# Patient Record
Sex: Female | Born: 1980
Health system: Southern US, Community
[De-identification: ages and names within clinical notes are randomized; demographics above are authoritative.]

## PROBLEM LIST (undated history)

## (undated) ENCOUNTER — Inpatient Hospital Stay: Payer: Self-pay

## (undated) DIAGNOSIS — F419 Anxiety disorder, unspecified: Secondary | ICD-10-CM

## (undated) DIAGNOSIS — R112 Nausea with vomiting, unspecified: Secondary | ICD-10-CM

## (undated) DIAGNOSIS — O139 Gestational [pregnancy-induced] hypertension without significant proteinuria, unspecified trimester: Secondary | ICD-10-CM

## (undated) DIAGNOSIS — Z9889 Other specified postprocedural states: Secondary | ICD-10-CM

## (undated) HISTORY — PX: BACK SURGERY: SHX140

---
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
## 2006-07-04 ENCOUNTER — Ambulatory Visit: Payer: Self-pay | Admitting: Gastroenterology

## 2006-07-12 ENCOUNTER — Ambulatory Visit: Payer: Self-pay | Admitting: Gastroenterology

## 2007-07-28 ENCOUNTER — Ambulatory Visit: Payer: Self-pay | Admitting: Obstetrics and Gynecology

## 2008-04-22 ENCOUNTER — Ambulatory Visit: Payer: Self-pay | Admitting: Obstetrics and Gynecology

## 2008-04-23 ENCOUNTER — Ambulatory Visit: Payer: Self-pay | Admitting: Obstetrics and Gynecology

## 2009-06-27 ENCOUNTER — Observation Stay: Payer: Self-pay | Admitting: Obstetrics and Gynecology

## 2009-07-01 ENCOUNTER — Ambulatory Visit: Payer: Self-pay | Admitting: Obstetrics and Gynecology

## 2009-07-04 ENCOUNTER — Inpatient Hospital Stay: Payer: Self-pay | Admitting: Obstetrics and Gynecology

## 2011-09-07 ENCOUNTER — Ambulatory Visit: Payer: Self-pay | Admitting: Gastroenterology

## 2014-10-08 DIAGNOSIS — N970 Female infertility associated with anovulation: Secondary | ICD-10-CM | POA: Insufficient documentation

## 2015-12-23 LAB — OB RESULTS CONSOLE HEPATITIS B SURFACE ANTIGEN: HEP B S AG: NEGATIVE

## 2015-12-23 LAB — OB RESULTS CONSOLE RUBELLA ANTIBODY, IGM: Rubella: IMMUNE

## 2015-12-23 LAB — OB RESULTS CONSOLE RPR: RPR: NONREACTIVE

## 2015-12-23 LAB — OB RESULTS CONSOLE VARICELLA ZOSTER ANTIBODY, IGG: VARICELLA IGG: IMMUNE

## 2016-06-14 ENCOUNTER — Inpatient Hospital Stay: Admission: RE | Admit: 2016-06-14 | Payer: Self-pay | Source: Ambulatory Visit

## 2016-06-19 LAB — OB RESULTS CONSOLE GC/CHLAMYDIA
Chlamydia: NEGATIVE
GC PROBE AMP, GENITAL: NEGATIVE

## 2016-06-19 LAB — OB RESULTS CONSOLE GBS: GBS: POSITIVE

## 2016-06-26 NOTE — Pre-Procedure Instructions (Signed)
SEEN [facility] DR Kenard GowerREW [facility] 06/14/16 1345

## 2016-06-29 ENCOUNTER — Observation Stay
Admission: EM | Admit: 2016-06-29 | Discharge: 2016-06-29 | Disposition: A | Discharge status: Home / Self Care | Payer: BLUE CROSS/BLUE SHIELD | Attending: Obstetrics and Gynecology | Admitting: Obstetrics and Gynecology

## 2016-06-29 DIAGNOSIS — Z3689 Encounter for other specified antenatal screening: Secondary | ICD-10-CM

## 2016-06-29 DIAGNOSIS — Z3A37 37 weeks gestation of pregnancy: Secondary | ICD-10-CM | POA: Diagnosis present

## 2016-06-29 DIAGNOSIS — O163 Unspecified maternal hypertension, third trimester: Principal | ICD-10-CM | POA: Insufficient documentation

## 2016-06-29 HISTORY — DX: Other specified postprocedural states: Z98.890

## 2016-06-29 HISTORY — DX: Nausea with vomiting, unspecified: R11.2

## 2016-06-29 HISTORY — DX: Gestational (pregnancy-induced) hypertension without significant proteinuria, unspecified trimester: O13.9

## 2016-06-29 MED ORDER — PRENATAL MULTIVITAMIN CH
1.0000 | ORAL_TABLET | Freq: Every day | ORAL | Status: DC
Start: 2016-06-30 — End: 2016-06-29

## 2016-06-29 NOTE — MAU Provider Note (Signed)
   Triage visit for NST   Alyssa Reeves is a 35 y.o. G2P1000. She is at 5579w3d gestation. She presents for a scheduled NST after calling the office today with elevated BP to the high 140s at home.  Indication: gHTN  S: Resting comfortably. no CTX, no VB. Active fetal movement. No HA, S/S of PreE  O:  BP 132/77   Pulse 97   Temp 97.9 F (36.6 C) (Oral)   Resp 18   LMP 10/11/2015 (Approximate)  No results found for this or any previous visit (from the past 48 hour(s)).   BP 125/82, 134/84, 132/77  Gen: NAD, AAOx3      Abd: FNTTP      Ext: Non-tender, Nonedmeatous   Last labs in office 06/19/16- neg   FHT: 145, mod var, +accels, no decels that were not associated with maternal repositioning TOCO: irregular SVE: Dilation: Fingertip Effacement (%): Thick Cervical Position: Posterior Station: -2 Exam by:: k. yates, rn   A/P:  35 y.o. G2P1000 3479w3d with gHTN, elevated BP at home but normal here.    Reactive NST, with moderate variability and accelerations, no decels  Fetal Wellbeing: Reassuring  D/c home stable, precautions reviewed, follow-up as scheduled.

## 2016-06-29 NOTE — Progress Notes (Signed)
Pt discharged home.  Discharge instructions and follow up appointment given to and reviewed with pt.  Pt verbalized understanding. 

## 2016-06-29 NOTE — Progress Notes (Signed)
Spoke with Dr. Dalbert GarnetBeasley via telephone about vital signs, NST, contractions, cervix check.  Pt to be discharged home, follow up in office as scheduled.

## 2016-06-29 NOTE — OB Triage Note (Signed)
Pt arrived from office per Dr. Dalbert GarnetBeasley for scheduled non stress test that was supposed to be performed at the office.  Dr. Dalbert GarnetBeasley requested pt to recieve test here d/t her being in a delivery currently.

## 2016-07-01 ENCOUNTER — Inpatient Hospital Stay
Admission: EM | Admit: 2016-07-01 | Discharge: 2016-07-04 | DRG: 766 | Disposition: A | Discharge status: Home / Self Care | Payer: BLUE CROSS/BLUE SHIELD | Attending: Obstetrics and Gynecology | Admitting: Obstetrics and Gynecology

## 2016-07-01 ENCOUNTER — Inpatient Hospital Stay: Payer: BLUE CROSS/BLUE SHIELD | Admitting: Anesthesiology

## 2016-07-01 ENCOUNTER — Encounter: Payer: Self-pay | Admitting: *Deleted

## 2016-07-01 ENCOUNTER — Encounter
Admission: EM | Disposition: A | Discharge status: Home / Self Care | Payer: Self-pay | Source: Home / Self Care | Attending: Obstetrics and Gynecology

## 2016-07-01 DIAGNOSIS — O34211 Maternal care for low transverse scar from previous cesarean delivery: Principal | ICD-10-CM | POA: Diagnosis present

## 2016-07-01 DIAGNOSIS — O134 Gestational [pregnancy-induced] hypertension without significant proteinuria, complicating childbirth: Secondary | ICD-10-CM | POA: Diagnosis present

## 2016-07-01 DIAGNOSIS — R000 Tachycardia, unspecified: Secondary | ICD-10-CM | POA: Diagnosis present

## 2016-07-01 DIAGNOSIS — O34219 Maternal care for unspecified type scar from previous cesarean delivery: Secondary | ICD-10-CM | POA: Diagnosis present

## 2016-07-01 DIAGNOSIS — Z9889 Other specified postprocedural states: Secondary | ICD-10-CM

## 2016-07-01 DIAGNOSIS — Z3A37 37 weeks gestation of pregnancy: Secondary | ICD-10-CM | POA: Diagnosis not present

## 2016-07-01 DIAGNOSIS — Z981 Arthrodesis status: Secondary | ICD-10-CM

## 2016-07-01 HISTORY — DX: Anxiety disorder, unspecified: F41.9

## 2016-07-01 LAB — TYPE AND SCREEN
ABO/RH(D): O POS
Antibody Screen: NEGATIVE

## 2016-07-01 LAB — CBC
HEMATOCRIT: 40.9 % (ref 35.0–47.0)
HEMOGLOBIN: 14.5 g/dL (ref 12.0–16.0)
MCH: 34.1 pg — AB (ref 26.0–34.0)
MCHC: 35.5 g/dL (ref 32.0–36.0)
MCV: 96 fL (ref 80.0–100.0)
Platelets: 168 10*3/uL (ref 150–440)
RBC: 4.26 MIL/uL (ref 3.80–5.20)
RDW: 13.7 % (ref 11.5–14.5)
WBC: 7 10*3/uL (ref 3.6–11.0)

## 2016-07-01 SURGERY — Surgical Case
Anesthesia: Spinal

## 2016-07-01 MED ORDER — DIPHENHYDRAMINE HCL 50 MG/ML IJ SOLN: 12.5000 mg | Freq: Four times a day (QID) | INTRAMUSCULAR | Status: DC | PRN

## 2016-07-01 MED ORDER — DIPHENHYDRAMINE HCL 25 MG PO CAPS
25.0000 mg | ORAL_CAPSULE | Freq: Four times a day (QID) | ORAL | Status: DC | PRN
Start: 2016-07-01 — End: 2016-07-04

## 2016-07-01 MED ORDER — IBUPROFEN 600 MG PO TABS
600.0000 mg | ORAL_TABLET | Freq: Four times a day (QID) | ORAL | Status: DC
  Administered 2016-07-02 (×3): 600 mg via ORAL
  Filled 2016-07-01 (×3): qty 1

## 2016-07-01 MED ORDER — OXYCODONE-ACETAMINOPHEN 5-325 MG PO TABS
1.0000 | ORAL_TABLET | ORAL | Status: DC | PRN
  Administered 2016-07-01: 1 via ORAL
  Administered 2016-07-02 – 2016-07-03 (×6): 2 via ORAL
  Administered 2016-07-03 (×2): 1 via ORAL
  Administered 2016-07-03 – 2016-07-04 (×2): 2 via ORAL
  Administered 2016-07-04: 1 via ORAL
  Filled 2016-07-01 (×2): qty 2
  Filled 2016-07-01: qty 1
  Filled 2016-07-01: qty 2
  Filled 2016-07-01: qty 1
  Filled 2016-07-01 (×2): qty 2
  Filled 2016-07-01: qty 1
  Filled 2016-07-01 (×5): qty 2

## 2016-07-01 MED ORDER — CEFAZOLIN SODIUM-DEXTROSE 2-4 GM/100ML-% IV SOLN
2.0000 g | Freq: Once | INTRAVENOUS | Status: AC
  Administered 2016-07-01 (×2): 2 g via INTRAVENOUS
  Filled 2016-07-01: qty 100

## 2016-07-01 MED ORDER — OXYTOCIN 40 UNITS IN LACTATED RINGERS INFUSION - SIMPLE MED
2.5000 [IU]/h | INTRAVENOUS | Status: DC
Start: 2016-07-01 — End: 2016-07-01
  Administered 2016-07-01: 40 mL via INTRAVENOUS
  Filled 2016-07-01: qty 1000

## 2016-07-01 MED ORDER — SIMETHICONE 80 MG PO CHEW
80.0000 mg | CHEWABLE_TABLET | Freq: Three times a day (TID) | ORAL | Status: DC
  Administered 2016-07-02 – 2016-07-04 (×7): 80 mg via ORAL
  Filled 2016-07-01 (×7): qty 1

## 2016-07-01 MED ORDER — MORPHINE SULFATE 2 MG/ML IV SOLN: INTRAVENOUS | Status: DC

## 2016-07-01 MED ORDER — ONDANSETRON HCL 4 MG/2ML IJ SOLN: 4.0000 mg | Freq: Four times a day (QID) | INTRAMUSCULAR | Status: DC | PRN

## 2016-07-01 MED ORDER — LACTATED RINGERS IV SOLN
INTRAVENOUS | Status: DC
  Administered 2016-07-01: 19:00:00 via INTRAVENOUS

## 2016-07-01 MED ORDER — PHENYLEPHRINE HCL 10 MG/ML IJ SOLN
INTRAMUSCULAR | Status: DC | PRN
  Administered 2016-07-01: 200 ug via INTRAVENOUS

## 2016-07-01 MED ORDER — SODIUM CHLORIDE 0.9% FLUSH: 9.0000 mL | INTRAVENOUS | Status: DC | PRN

## 2016-07-01 MED ORDER — ONDANSETRON HCL 4 MG/2ML IJ SOLN: 4.0000 mg | Freq: Once | INTRAMUSCULAR | Status: DC | PRN

## 2016-07-01 MED ORDER — MENTHOL 3 MG MT LOZG: 1.0000 | LOZENGE | OROMUCOSAL | Status: DC | PRN

## 2016-07-01 MED ORDER — ONDANSETRON HCL 4 MG/2ML IJ SOLN
INTRAMUSCULAR | Status: DC | PRN
  Administered 2016-07-01: 4 mg via INTRAVENOUS

## 2016-07-01 MED ORDER — SOD CITRATE-CITRIC ACID 500-334 MG/5ML PO SOLN
ORAL | Status: AC
  Administered 2016-07-01: 30 mL via ORAL
  Filled 2016-07-01: qty 15

## 2016-07-01 MED ORDER — SIMETHICONE 80 MG PO CHEW
80.0000 mg | CHEWABLE_TABLET | ORAL | Status: DC
  Administered 2016-07-02 – 2016-07-03 (×2): 80 mg via ORAL
  Filled 2016-07-01 (×2): qty 1

## 2016-07-01 MED ORDER — BUPIVACAINE 0.25 % ON-Q PUMP DUAL CATH 400 ML
400.0000 mL | INJECTION | Status: DC
  Filled 2016-07-01: qty 400

## 2016-07-01 MED ORDER — FENTANYL CITRATE (PF) 100 MCG/2ML IJ SOLN
INTRAMUSCULAR | Status: AC
  Administered 2016-07-01: 25 ug via INTRAVENOUS
  Filled 2016-07-01: qty 2

## 2016-07-01 MED ORDER — SIMETHICONE 80 MG PO CHEW: 80.0000 mg | CHEWABLE_TABLET | ORAL | Status: DC | PRN

## 2016-07-01 MED ORDER — LACTATED RINGERS IV SOLN
INTRAVENOUS | Status: DC
  Administered 2016-07-02: 12:00:00 via INTRAVENOUS

## 2016-07-01 MED ORDER — ACETAMINOPHEN 325 MG PO TABS: 650.0000 mg | ORAL_TABLET | ORAL | Status: DC | PRN

## 2016-07-01 MED ORDER — SENNOSIDES-DOCUSATE SODIUM 8.6-50 MG PO TABS
2.0000 | ORAL_TABLET | ORAL | Status: DC
  Administered 2016-07-02 – 2016-07-03 (×2): 2 via ORAL
  Filled 2016-07-01 (×2): qty 2

## 2016-07-01 MED ORDER — TETANUS-DIPHTH-ACELL PERTUSSIS 5-2.5-18.5 LF-MCG/0.5 IM SUSP: 0.5000 mL | Freq: Once | INTRAMUSCULAR | Status: DC

## 2016-07-01 MED ORDER — DIPHENHYDRAMINE HCL 12.5 MG/5ML PO ELIX
12.5000 mg | ORAL_SOLUTION | Freq: Four times a day (QID) | ORAL | Status: DC | PRN
  Filled 2016-07-01: qty 5

## 2016-07-01 MED ORDER — OXYCODONE-ACETAMINOPHEN 5-325 MG PO TABS: 2.0000 | ORAL_TABLET | ORAL | Status: DC | PRN

## 2016-07-01 MED ORDER — SOD CITRATE-CITRIC ACID 500-334 MG/5ML PO SOLN
30.0000 mL | ORAL | Status: DC | PRN
  Administered 2016-07-01: 30 mL via ORAL

## 2016-07-01 MED ORDER — BUPIVACAINE IN DEXTROSE 0.75-8.25 % IT SOLN
INTRATHECAL | Status: DC | PRN
  Administered 2016-07-01: 1.65 mL via INTRATHECAL

## 2016-07-01 MED ORDER — PRENATAL MULTIVITAMIN CH
1.0000 | ORAL_TABLET | Freq: Every day | ORAL | Status: DC
  Administered 2016-07-02 – 2016-07-03 (×2): 1 via ORAL
  Filled 2016-07-01 (×4): qty 1

## 2016-07-01 MED ORDER — COCONUT OIL OIL
1.0000 "application " | TOPICAL_OIL | Status: DC | PRN
  Administered 2016-07-03: 1 via TOPICAL
  Filled 2016-07-01: qty 120

## 2016-07-01 MED ORDER — DIBUCAINE 1 % RE OINT: 1.0000 "application " | TOPICAL_OINTMENT | RECTAL | Status: DC | PRN

## 2016-07-01 MED ORDER — WITCH HAZEL-GLYCERIN EX PADS: 1.0000 "application " | MEDICATED_PAD | CUTANEOUS | Status: DC | PRN

## 2016-07-01 MED ORDER — NALBUPHINE HCL 10 MG/ML IJ SOLN: 5.0000 mg | INTRAMUSCULAR | Status: DC | PRN

## 2016-07-01 MED ORDER — BUPIVACAINE IN DEXTROSE 0.75-8.25 % IT SOLN: INTRATHECAL | Status: DC | PRN

## 2016-07-01 MED ORDER — LACTATED RINGERS IV SOLN
500.0000 mL | INTRAVENOUS | Status: DC | PRN
  Administered 2016-07-01 (×2): via INTRAVENOUS
  Administered 2016-07-01: 1000 mL via INTRAVENOUS

## 2016-07-01 MED ORDER — FENTANYL CITRATE (PF) 100 MCG/2ML IJ SOLN
25.0000 ug | INTRAMUSCULAR | Status: DC | PRN
  Administered 2016-07-01 (×8): 25 ug via INTRAVENOUS

## 2016-07-01 MED ORDER — BUPIVACAINE HCL (PF) 0.5 % IJ SOLN
INTRAMUSCULAR | Status: AC
  Filled 2016-07-01: qty 30

## 2016-07-01 MED ORDER — OXYTOCIN BOLUS FROM INFUSION: 500.0000 mL | Freq: Once | INTRAVENOUS | Status: DC

## 2016-07-01 MED ORDER — BUPIVACAINE HCL 0.5 % IJ SOLN
INTRAMUSCULAR | Status: DC | PRN
  Administered 2016-07-01: 10 mL

## 2016-07-01 MED ORDER — LIDOCAINE HCL (PF) 1 % IJ SOLN: 30.0000 mL | INTRAMUSCULAR | Status: DC | PRN

## 2016-07-01 MED ORDER — ZOLPIDEM TARTRATE 5 MG PO TABS: 5.0000 mg | ORAL_TABLET | Freq: Every evening | ORAL | Status: DC | PRN

## 2016-07-01 MED ORDER — NALOXONE HCL 0.4 MG/ML IJ SOLN: 0.4000 mg | INTRAMUSCULAR | Status: DC | PRN

## 2016-07-01 MED ORDER — OXYTOCIN 40 UNITS IN LACTATED RINGERS INFUSION - SIMPLE MED
2.5000 [IU]/h | INTRAVENOUS | Status: AC
  Administered 2016-07-01: 2.5 [IU]/h via INTRAVENOUS

## 2016-07-01 SURGICAL SUPPLY — 24 items
BARRIER ADHS 3X4 INTERCEED (GAUZE/BANDAGES/DRESSINGS) ×2 IMPLANT
CANISTER SUCT 3000ML (MISCELLANEOUS) ×2 IMPLANT
CATH KIT ON-Q SILVERSOAK 5IN (CATHETERS) ×4 IMPLANT
CHLORAPREP W/TINT 26ML (MISCELLANEOUS) ×2 IMPLANT
DRESSING TELFA 4X3 1S ST N-ADH (GAUZE/BANDAGES/DRESSINGS) ×2 IMPLANT
DRSG TELFA 3X8 NADH (GAUZE/BANDAGES/DRESSINGS) ×2 IMPLANT
ELECT CAUTERY BLADE 6.4 (BLADE) ×2 IMPLANT
ELECT REM PT RETURN 9FT ADLT (ELECTROSURGICAL) ×2
ELECTRODE REM PT RTRN 9FT ADLT (ELECTROSURGICAL) ×1 IMPLANT
GAUZE SPONGE 4X4 12PLY STRL (GAUZE/BANDAGES/DRESSINGS) ×2 IMPLANT
GLOVE BIO SURGEON STRL SZ8 (GLOVE) ×2 IMPLANT
GOWN STRL REUS W/ TWL LRG LVL3 (GOWN DISPOSABLE) ×2 IMPLANT
GOWN STRL REUS W/ TWL XL LVL3 (GOWN DISPOSABLE) ×1 IMPLANT
GOWN STRL REUS W/TWL LRG LVL3 (GOWN DISPOSABLE) ×2
GOWN STRL REUS W/TWL XL LVL3 (GOWN DISPOSABLE) ×1
NS IRRIG 1000ML POUR BTL (IV SOLUTION) ×2 IMPLANT
PACK C SECTION AR (MISCELLANEOUS) ×2 IMPLANT
PAD OB MATERNITY 4.3X12.25 (PERSONAL CARE ITEMS) ×2 IMPLANT
PAD PREP 24X41 OB/GYN DISP (PERSONAL CARE ITEMS) ×2 IMPLANT
STAPLER INSORB 30 2030 C-SECTI (MISCELLANEOUS) ×2 IMPLANT
STRAP SAFETY BODY (MISCELLANEOUS) ×2 IMPLANT
SUT CHROMIC 1 CTX 36 (SUTURE) ×6 IMPLANT
SUT PLAIN GUT 0 (SUTURE) IMPLANT
SUT VIC AB 0 CT1 36 (SUTURE) ×4 IMPLANT

## 2016-07-01 NOTE — H&P (Signed)
Alyssa MarinerKimberly S Reeves is a 35 y.o. female G3P1 at 37+5 based on a 20 +3 u/s ( approx LMP placed her at 39+5) presenting for regular ctx and low pain pain . Cx 1 cm . Marland Kitchen.Previous c/s . Marland Kitchen. Declines BTL  Pulse rate elevated today 100-150 . Eating and drinking OK . Last po late am today . OB History    Gravida Para Term Preterm AB Living   2 1 1          SAB TAB Ectopic Multiple Live Births                 Past Medical History:  Diagnosis Date  . Anxiety    no longer treated for anxiety  . PONV (postoperative nausea and vomiting)   . Pregnancy induced hypertension    Past Surgical History:  Procedure Laterality Date  . 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 CESAREAN SECTION     Family History: family history is not on file. Social History:  reports that she has never smoked. She has never used smokeless tobacco. She reports that she does not drink alcohol or use drugs.     Maternal Diabetes: No Genetic Screening: Declined Maternal Ultrasounds/Referrals: Normal Fetal Ultrasounds or other Referrals:  None Maternal Substance Abuse:  No Significant Maternal Medications:  None Significant Maternal Lab Results:  None Other Comments:  None  ROS History Dilation: 1 Effacement (%): 30 Station: -2 Exam by:: JPB RN Blood pressure 135/84, pulse (!) 107, temperature 98.1 F (36.7 C), temperature source Oral, resp. rate 16, height 5\' 1"  (1.549 m), weight 120 lb (54.4 kg), last menstrual period 10/11/2015, SpO2 100 %. Exam Physical Exam  Lungs CTA  CV tachycardia , RR   abd soft NT  FHR 120 + accels reactive , no decels . CTX q 1-2 minutes O+ Prenatal labs: ABO, Rh:  O+ Antibody:  neg  Rubella:  Imm RPR:   NR  HBsAg:   neg  HIV:   Neg GBS:   unsure   Assessment/Plan: 37+5 week with regular painful ctx . Given tachycardia have not been able to use terbutaline . No SOM . Sinus tachy   Recommend repeat c/s tonight . Pt is aware of the risks of the procedure  and all questions answered .  Retal Tonkinson 07/01/2016, 6:09 PM

## 2016-07-01 NOTE — Transfer of Care (Signed)
Immediate Anesthesia Transfer of Care Note  Patient: Alyssa MarinerKimberly S Dirosa  Procedure(s) Performed: Procedure(s): cesarean section (N/A)  Patient Location: PACU  Anesthesia Type:Spinal  Level of Consciousness: awake, alert  and oriented  Airway & Oxygen Therapy: Patient Spontanous Breathing and Patient connected to nasal cannula oxygen  Post-op Assessment: Report given to RN and Post -op Vital signs reviewed and stable  Post vital signs: Reviewed and stable  Last Vitals:  Vitals:   07/01/16 1553 07/01/16 1639  BP: 135/84 135/84  Pulse: (!) 102 (!) 107  Resp: 16 16  Temp:      Last Pain:  Vitals:   07/01/16 1629  TempSrc:   PainSc: 3          Complications: No apparent anesthesia complications

## 2016-07-01 NOTE — Anesthesia Procedure Notes (Signed)
Spinal  Patient location during procedure: OR Start time: 07/01/2016 7:06 PM End time: 07/01/2016 7:10 PM Staffing Anesthesiologist: Yves DillARROLL, Mieko Kneebone Performed: anesthesiologist  Preanesthetic Checklist Completed: patient identified, site marked, surgical consent, pre-op evaluation, timeout performed, IV checked, risks and benefits discussed and monitors and equipment checked Spinal Block Patient position: sitting Prep: Betadine and site prepped and draped Patient monitoring: heart rate, cardiac monitor, continuous pulse ox and blood pressure Approach: midline Location: L3-4 Injection technique: single-shot Needle Needle type: Whitacre  Needle gauge: 25 G Needle length: 9 cm Assessment Sensory level: T4 Additional Notes Time out called.  Patient placed in sitting position.  Back prepped and draped in sterile fashion.  A skin wheal was made with 1% Lidocaine plain in the L3-L4 interspace.  A 25 G Whitacre needle was used for the procedure with the return of clear, colorless CSF in all 4 quadrants.  No blood or paresthesias occurred during the procedure.  12.5 mg of spinal marcaine + epi wash 1: 200K.  Patient tolerated the procedure well.

## 2016-07-01 NOTE — Anesthesia Preprocedure Evaluation (Signed)
Anesthesia Evaluation  Patient identified by MRN, date of birth, ID band Patient awake    Reviewed: Allergy & Precautions, NPO status , Patient's Chart, lab work & pertinent test results  History of Anesthesia Complications (+) PONV and history of anesthetic complications  Airway Mallampati: II  TM Distance: >3 FB     Dental no notable dental hx.    Pulmonary neg pulmonary ROS,    Pulmonary exam normal        Cardiovascular hypertension, Normal cardiovascular exam     Neuro/Psych negative neurological ROS  negative psych ROS   GI/Hepatic negative GI ROS, Neg liver ROS,   Endo/Other  negative endocrine ROS  Renal/GU negative Renal ROS  negative genitourinary   Musculoskeletal Prior upper thoracic fusion   Abdominal Normal abdominal exam  (+)   Peds negative pediatric ROS (+)  Hematology negative hematology ROS (+)   Anesthesia Other Findings Prior C-S Prior upper thoracic fusion  Reproductive/Obstetrics (+) Pregnancy                             Anesthesia Physical Anesthesia Plan  ASA: II and emergent  Anesthesia Plan: Spinal   Post-op Pain Management:    Induction: Intravenous  Airway Management Planned: Nasal Cannula  Additional Equipment:   Intra-op Plan:   Post-operative Plan:   Informed Consent: I have reviewed the patients History and Physical, chart, labs and discussed the procedure including the risks, benefits and alternatives for the proposed anesthesia with the patient or authorized representative who has indicated his/her understanding and acceptance.   Dental advisory given  Plan Discussed with: CRNA and Surgeon  Anesthesia Plan Comments:         Anesthesia Quick Evaluation

## 2016-07-01 NOTE — Brief Op Note (Addendum)
07/01/2016  7:55 PM  PATIENT:  Ashley MarinerKimberly S Sciarra  35 y.o. female  PRE-OPERATIVE DIAGNOSIS:  previous cesarean section in labor  POST-OPERATIVE DIAGNOSIS:  previous cesarean section in labor  PROCEDURE:  Low transverse cesarean section , delivery at 1927 on 07/01/16. Vigorous female APGARS 8/9 weight 3380 gm SURGEON:  Surgeon(s) and Role:    Suzy Bouchard* Hiroko Tregre J Bela Nyborg, MD - Primary  PHYSICIAN ASSISTANT:scrub tech    ASSISTANTS: none   ANESTHESIA:   spinal  EBL:  Total I/O In: 1900 [I.V.:1900] Out: 700 [Urine:100; Blood:600]  BLOOD ADMINISTERED:none  DRAINS: Urinary Catheter (Foley)   LOCAL MEDICATIONS USED:  MARCAINE     SPECIMEN:  No Specimen  DISPOSITION OF SPECIMEN:  N/A  COUNTS:  YES  TOURNIQUET:  * No tourniquets in log *  DICTATION: .Other Dictation: Dictation Number verbal  PLAN OF CARE: Admit to inpatient   PATIENT DISPOSITION:  PACU - hemodynamically stable.   Delay start of Pharmacological VTE agent (>24hrs) due to surgical blood loss or risk of bleeding: not applicable

## 2016-07-02 ENCOUNTER — Encounter: Payer: Self-pay | Admitting: Obstetrics and Gynecology

## 2016-07-02 LAB — CBC
HEMATOCRIT: 31.7 % — AB (ref 35.0–47.0)
Hemoglobin: 11.2 g/dL — ABNORMAL LOW (ref 12.0–16.0)
MCH: 33.7 pg (ref 26.0–34.0)
MCHC: 35.4 g/dL (ref 32.0–36.0)
MCV: 95.4 fL (ref 80.0–100.0)
PLATELETS: 128 10*3/uL — AB (ref 150–440)
RBC: 3.32 MIL/uL — ABNORMAL LOW (ref 3.80–5.20)
RDW: 13.5 % (ref 11.5–14.5)
WBC: 11.9 10*3/uL — ABNORMAL HIGH (ref 3.6–11.0)

## 2016-07-02 MED ORDER — IBUPROFEN 600 MG PO TABS: 600.0000 mg | ORAL_TABLET | Freq: Four times a day (QID) | ORAL | Status: DC

## 2016-07-02 MED ORDER — IBUPROFEN 600 MG PO TABS
600.0000 mg | ORAL_TABLET | Freq: Four times a day (QID) | ORAL | Status: DC
  Administered 2016-07-03 (×4): 600 mg via ORAL
  Filled 2016-07-02 (×4): qty 1

## 2016-07-02 MED ORDER — IBUPROFEN 600 MG PO TABS
600.0000 mg | ORAL_TABLET | Freq: Four times a day (QID) | ORAL | Status: DC
  Administered 2016-07-02: 600 mg via ORAL
  Filled 2016-07-02 (×2): qty 1

## 2016-07-02 NOTE — Anesthesia Postprocedure Evaluation (Signed)
Anesthesia Post Note  Patient: Alyssa Reeves  Procedure(s) Performed: Procedure(s) (LRB): cesarean section (N/A)  Patient location during evaluation: Mother Baby Anesthesia Type: Spinal Level of consciousness: awake and alert and oriented Pain management: satisfactory to patient Vital Signs Assessment: post-procedure vital signs reviewed and stable Respiratory status: respiratory function stable Cardiovascular status: stable Postop Assessment: no headache, no backache, adequate PO intake, spinal receding, patient able to bend at knees and no signs of nausea or vomiting Anesthetic complications: no    Last Vitals:  Vitals:   07/02/16 0200 07/02/16 0300  BP: 130/86 127/84  Pulse: 75 83  Resp: 20 19  Temp: 36.7 C 36.6 C    Last Pain:  Vitals:   07/02/16 0520  TempSrc:   PainSc: 3                  Clydene PughBeane, Relda Agosto D

## 2016-07-02 NOTE — Op Note (Signed)
[name]:  Reeves, Alyssa              ACCOUNT NO.:  [id number]  MEDICAL RECORD NO.:  [id number]  LOCATION:  339A                         FACILITY:  [facility]  PHYSICIAN:  Jennell Cornerhomas Schermerhorn, MDDATE OF BIRTH:  01/15/1981  DATE OF PROCEDURE: DATE OF DISCHARGE:                              OPERATIVE REPORT   PREOPERATIVE DIAGNOSIS: 1. 37+ 5 weeks estimated gestational age. 2. Regular painful contractions with a history of a prior cesarean     section.  POSTOPERATIVE DIAGNOSIS: 1. 37+ 5 weeks estimated gestational age. 2. Regular painful contractions with a history of a prior cesarean     section.  PROCEDURE:  Elective repeat low transverse cesarean section.  ANESTHESIA:  Spinal.  SURGEON:  Jennell Cornerhomas Schermerhorn, MD.  FIRST ASSISTANT:  Scrub tech.  INDICATIONS:  A 35 year old, gravida 3, para 1, the patient at 37+ 5 weeks.  The patient was admitted to Labor and Delivery with routine regular contractions every 1 to 2 minutes, painful.  Cervix was dilated to 1 cm.  The patient had previously decided on elective repeat cesarean section.  DESCRIPTION OF PROCEDURE:  After adequate spinal anesthesia, the patient was placed in dorsal supine position with the hip rolled on the right side.  The patient's abdomen was prepped and draped in normal sterile fashion.  The patient did receive 2 g IV Ancef prior to commencement of the case.  Time-out was performed.  A Pfannenstiel incision was made. Sharp dissection was used to identify the fascia.  Fascia was opened in the midline and opened in a transverse fashion.  Superior aspect of the fascia was grasped with Kocher clamps and the recti muscles dissected free.  The inferior aspect of the fascia was grasped with Kocher clamps and the pyramidalis muscle was dissected free.  Peritoneal cavity was opened sharply.  The vesicular uterine peritoneal fold was identified and opened and the bladder was reflected inferiorly.  A low transverse uterine  incision was made upon entry into the endometrial cavity.  Clear fluid resulted.  The incision was extended with blunt transverse traction.  Fetal head was brought to the incision and vacuum was applied to the occiput with 1 gentle pull.  The head was delivered and the vacuum was removed.  Shoulders and body were delivered without difficulty.  Vigorous female was dried on the abdomen for 1 minute for delayed cord clamping and then the infant was passed to nursery staff who assigned Apgar scores of 8 and 9, weight 3380 g.  Time of birth [id number] hours, female.  The placenta was manually delivered.  The uterus was exteriorized.  The endometrial cavity was wiped clean with laparotomy tape and the cervix was then opened with a ring forceps.  The uterine incision was closed with 1 chromic suture in a running locking fashion. Good approximation of the edges.  Good hemostasis was noted.  Fallopian tubes and ovaries appeared normal.  Posterior cul-de-sac was irrigated and suctioned.  The uterus was placed back into the abdominal cavity and the paracolic gutters were wiped clean with laparotomy tape.  Again, the uterine incision appeared hemostatic and Interceed was placed over the uterine incision in a T-shaped fashion.  The superior aspect of the fascia  was regrasped with Kocher clamps and the On-Q pump catheters were placed from infraumbilical position to a subfascial position.  Fascia was then closed over top of the catheters with 0 Vicryl suture in a running nonlocking fashion.  The subcutaneous tissues were irrigated and bovied for hemostasis.  The skin was reapproximated with Insorb absorbable staples with good cosmetic effect.  The On-Q pump catheters were secured at the skin level with Dermabond and Steri-Strips and then Tegaderm was placed over top of these.  Each catheter was loaded with 5 mL of 0.5% Marcaine.  There were no complications.  ESTIMATED BLOOD LOSS:  600 mL.  INTRAOPERATIVE  FLUIDS:  1900 mL and 100 mL of urine.  The patient tolerated the procedure well, was taken to recovery room in good condition.          ______________________________ Jennell Corner, MD     TS/MEDQ  D:  07/01/2016  T:  07/02/2016  Job:  [id number]

## 2016-07-02 NOTE — Progress Notes (Signed)
Subjective: Postpartum Day 1: Cesarean Delivery Patient reports tolerating PO, + flatus and no problems voiding.    Objective: Vital signs in last 24 hours: Temp:  [97.4 F (36.3 C)-98.4 F (36.9 C)] 97.9 F (36.6 C) (08/28 2000) Pulse Rate:  [74-147] 84 (08/28 2000) Resp:  [15-29] 18 (08/28 2000) BP: (93-145)/(23-87) 132/77 (08/28 2000) SpO2:  [99 %-100 %] 99 % (08/28 2000)  Physical Exam:  General: alert, cooperative and appears stated age 71Lochia: appropriate Uterine Fundus: firm Incision: healing well, no significant drainage, no dehiscence, no significant erythema DVT Evaluation: No evidence of DVT seen on physical exam.   Recent Labs  07/01/16 1822 07/02/16 0518  HGB 14.5 11.2*  HCT 40.9 31.7*    Assessment/Plan: Status post Cesarean section. Doing well postoperatively.  Continue current care.  Christeen DouglasBEASLEY, Gilbert Manolis 07/02/2016, 8:17 PM

## 2016-07-02 NOTE — Anesthesia Post-op Follow-up Note (Signed)
  Anesthesia Pain Follow-up Note  Patient: Alyssa Reeves  Day #: 1  Date of Follow-up: 07/02/2016 Time: 7:17 AM  Last Vitals:  Vitals:   07/02/16 0200 07/02/16 0300  BP: 130/86 127/84  Pulse: 75 83  Resp: 20 19  Temp: 36.7 C 36.6 C    Level of Consciousness: alert  Pain: mild   Side Effects:None  Catheter Site Exam: site not evaluated     Plan: D/C from anesthesia care  Clydene PughBeane, Matt Delpizzo D

## 2016-07-03 LAB — RPR: RPR: NONREACTIVE

## 2016-07-03 MED ORDER — FERROUS SULFATE 325 (65 FE) MG PO TABS
325.0000 mg | ORAL_TABLET | Freq: Every day | ORAL | Status: DC
  Administered 2016-07-03 – 2016-07-04 (×2): 325 mg via ORAL
  Filled 2016-07-03 (×2): qty 1

## 2016-07-03 NOTE — Progress Notes (Signed)
POSTOPERATIVE DAY # 2 S/P Repeat LTCS  S:         Reports feeling good             Tolerating po intake / no nausea / no vomiting / + flatus / no BM             Bleeding is light             Pain controlled with Motrin, Percocet, On-Q pump             Up ad lib / ambulatory/ voiding QS  Newborn breast feeding - going well / has been using a pacifier for soothing and she states now infant is having trouble with latching - we discussed nipple confusion and advised to work with lactation again today    O:  VS: BP 125/79 (BP Location: Right Arm)   Pulse 87   Temp 98.5 F (36.9 C) (Oral)   Resp 18   Ht 5\' 1"  (1.549 m)   Wt 54.4 kg (120 lb)   LMP 10/11/2015 (Approximate)   SpO2 100%   Breastfeeding? Unknown   BMI 22.67 kg/m    LABS:               Recent Labs  07/01/16 1822 07/02/16 0518  WBC 7.0 11.9*  HGB 14.5 11.2*  PLT 168 128*               Bloodtype: --/--/O POS (08/27 1821)  Rubella: Immune (02/17 0000)                                             I&O: Intake/Output      08/28 0701 - 08/29 0700 08/29 0701 - 08/30 0700   P.O. 0    I.V. (mL/kg) 960 (17.6)    Total Intake(mL/kg) 960 (17.6)    Urine (mL/kg/hr) 1600 (1.2)    Blood     Total Output 1600     Net -640                       Physical Exam:             Alert and Oriented X3  Lungs: Clear and unlabored  Heart: regular rate and rhythm / no mumurs  Abdomen: soft, non-tender, non-distended, bowel sounds active, On-Q pump - no erythema, no ecchymosis, no s/s of infection              Fundus: firm, non-tender, U-2             Dressing: old shadowing noted on dressing, no active drainage/ intact             Incision:  approximated with Insorb staples / no erythema / no ecchymosis / no drainage  Perineum: intact, small vulvar edema noted  Lochia: scant, no clots  Extremities: no edema, no calf pain or tenderness  A:        POD # 2 S/P Repeat LTCS            Mild ABL Anemia   P:        Routine postoperative  care              Begin Ferrous Sulfate daily   Lactation today  Anticipate discharge home tomorrow  Carlean JewsMeredith Sigmon, CNM

## 2016-07-03 NOTE — Lactation Note (Addendum)
This note was copied from a baby's chart. Lactation Consultation Note  Patient Name: Girl Devoria GlassingKimberly Fielding JYNWG'NToday's Date: 07/03/2016 Reason for consult: Follow-up assessment   Maternal Data  Mom's breasts have not yet filled with more milk; baby losing interest at breast even with breast compression; no swallows during brief attempts; hx of slow lactogenesis; baby at 7% wt loss with only 1 BM last 24 hours; dry oral mucous membranes; bili 9.6.  Nipples mostly intact with one bruise on left areola from the other day which mom says is healing well. Based on these findings (and Mom plans to BF only a few more weeks), we started giving 10 ml of Isomil Soy (per MD based on other child's problems with dairy) via curved tip syringe at breast. The quality of her sucking and swallowing improved greatly. (Mom refuses SNS) Fanny DanceFeedign was complete in approx 8 minutes and baby fell asleep.  PLAN: Pump/hand express every 2-3 hours: feed per cues (2-3 hours) offerring approx 10 ml. (adjust vol. By 5-10 ml as needed). May use curved tip syringe, spoon, slow flow bottle.   Feeding Feeding Type: Breast Fed Length of feed: 20 min  LATCH Score/Interventions Latch: Grasps breast easily, tongue down, lips flanged, rhythmical sucking. Intervention(s): Breast massage;Breast compression  Audible Swallowing: A few with stimulation (only wiht supplement) Intervention(s): Skin to skin  Type of Nipple: Everted at rest and after stimulation  Comfort (Breast/Nipple): Soft / non-tender     Hold (Positioning): Assistance needed to correctly position infant at breast and maintain latch. Intervention(s): Support Pillows  LATCH Score: 8  Lactation Tools Discussed/Used Tools: Other (comment) (curved tip syringe at breast)   Consult Status Consult Status: Follow-up    Sunday CornSandra Clark Keerstin Bjelland 07/03/2016, 5:43 PM  Correction: bili was 8.6

## 2016-07-04 MED ORDER — IBUPROFEN 600 MG PO TABS
600.0000 mg | ORAL_TABLET | Freq: Four times a day (QID) | ORAL | Status: DC
  Administered 2016-07-04: 600 mg via ORAL
  Filled 2016-07-04: qty 1

## 2016-07-04 MED ORDER — OXYCODONE-ACETAMINOPHEN 5-325 MG PO TABS: 1.0000 | ORAL_TABLET | ORAL | 0 refills | Status: DC | PRN

## 2016-07-04 MED ORDER — IBUPROFEN 600 MG PO TABS: 600.0000 mg | ORAL_TABLET | Freq: Four times a day (QID) | ORAL | 0 refills | Status: DC

## 2016-07-04 NOTE — Discharge Summary (Addendum)
  Obstetric Discharge Summary   Patient ID: Alyssa MarinerKimberly S Michelle MRN: [id number] DOB/AGE: 37-May-1982 37 y.o.   Date of Admission: 07/01/2016  Date of Discharge: 07/04/2016  Admitting Diagnosis: Onset of Labor at 651w5d, prior c/s   Secondary Diagnosis: Gestational hypertension  Mode of Delivery: repeat cesarean section on 07/01/16     Discharge Diagnosis: Postpartum care following cesarean delivery   Intrapartum Procedures:  Post partum procedures: none  Complications: none   Brief Hospital Course  (Cesarean Section): Alyssa MarinerKimberly S Laguna is a [id number] who underwent cesarean section on 07/01/2016.  Patient had an uncomplicated surgery; for further details of this surgery, please refer to the operative note.  Patient had an uncomplicated postpartum course.  By time of discharge on POD#3, her pain was controlled on oral pain medications; she had appropriate lochia and was ambulating, voiding without difficulty, tolerating regular diet and passing flatus.   She was deemed stable for discharge to home.    Labs: CBC Latest Ref Rng & Units 07/02/2016 07/01/2016  WBC 3.6 - 11.0 K/uL 11.9(H) 7.0  Hemoglobin 12.0 - 16.0 g/dL 11.2(L) 14.5  Hematocrit 35.0 - 47.0 % 31.7(L) 40.9  Platelets 150 - 440 K/uL 128(L) 168   O POS  Physical exam:  Blood pressure 118/78, pulse 90, temperature 98.4 F (36.9 C), temperature source Oral, resp. rate 18, height 5\' 1"  (1.549 m), weight 54.4 kg (120 lb), last menstrual period 10/11/2015, SpO2 98 %, unknown if currently breastfeeding. General: alert and no distress Lochia: appropriate Abdomen: soft, NT, On-Q pump in place Uterine Fundus: firm Incision: healing well, no significant drainage, no dehiscence, no significant erythema Extremities: No evidence of DVT seen on physical exam. No lower extremity edema.  Discharge Instructions: Per After Visit Summary. Activity: Advance as tolerated. Pelvic rest for 6 weeks.  Also refer to After Visit Summary Diet:  Regular Medications:percocet + motrin  Outpatient follow up:  Follow-up Information    Brittani Purdum, MD Follow up in 2 week(s).   Specialty:  Obstetrics and Gynecology Why:  Post-op Contact information: [address] [facility] [place] [id number] [phone]          Postpartum contraception: no method  Discharged Condition: good  Discharged to: home   Newborn Data:  Baby Girl named  Disposition:home with mother  Apgars: APGAR (1 MIN): 8   APGAR (5 MINS): 9    Baby Feeding: Bottle and Breast  Karena AddisonSigmon, Meredith C, CNM 07/04/2016

## 2016-07-04 NOTE — Progress Notes (Signed)
Pt discharged home with infant.  Discharge instructions and follow up appointment given to and reviewed with pt.  Pt verbalized understanding.  Escorted by auxillary. 

## 2016-07-05 NOTE — Discharge Summary (Signed)
Alyssa MarinerKimberly S Reeves is a 35 y.o. G2P1000. She is at 3568w3d gestation. She presents for a scheduled NST after calling the office today with elevated BP to the high 140s at home.  Indication: gHTN  S: Resting comfortably. no CTX, no VB. Active fetal movement. No HA, S/S of PreE  O:  BP 132/77   Pulse 97   Temp 97.9 F (36.6 C) (Oral)   Resp 18   LMP 10/11/2015 (Approximate)  No results found for this or any previous visit (from the past 48 hour(s)).   BP 125/82, 134/84, 132/77  Gen: NAD, AAOx3                                                                                   Abd: FNTTP                                                                                                       Ext: Non-tender, Nonedmeatous                    Last labs in office 06/19/16- neg                      FHT: 145, mod var, +accels, no decels that were not associated with maternal repositioning TOCO: irregular SVE: Dilation: Fingertip Effacement (%): Thick Cervical Position: Posterior Station: -2 Exam by:: k. yates, rn   A/P:  35 y.o. G2P1000 3768w3d with gHTN, elevated BP at home but normal here.    Reactive NST, with moderate variability and accelerations, no decels  Fetal Wellbeing: Reassuring  D/c home stable, precautions reviewed, follow-up as scheduled.

## 2016-07-12 ENCOUNTER — Inpatient Hospital Stay
Admission: RE | Admit: 2016-07-12 | Discharge: 2016-07-12 | Disposition: A | Discharge status: Home / Self Care | Payer: Self-pay | Source: Ambulatory Visit

## 2016-07-13 ENCOUNTER — Inpatient Hospital Stay: Admit: 2016-07-13 | Payer: BLUE CROSS/BLUE SHIELD | Admitting: Obstetrics and Gynecology

## 2016-07-13 SURGERY — Surgical Case
Anesthesia: Choice

## 2017-09-06 ENCOUNTER — Ambulatory Visit (INDEPENDENT_AMBULATORY_CARE_PROVIDER_SITE_OTHER): Payer: BLUE CROSS/BLUE SHIELD | Admitting: Physician Assistant

## 2017-09-06 ENCOUNTER — Encounter: Payer: Self-pay | Admitting: Physician Assistant

## 2017-09-06 VITALS — BP 100/64 | HR 88 | Temp 98.1°F | Resp 16 | Ht 61.0 in | Wt 100.0 lb

## 2017-09-06 DIAGNOSIS — F419 Anxiety disorder, unspecified: Secondary | ICD-10-CM | POA: Diagnosis not present

## 2017-09-06 DIAGNOSIS — Z13220 Encounter for screening for lipoid disorders: Secondary | ICD-10-CM | POA: Diagnosis not present

## 2017-09-06 DIAGNOSIS — Z131 Encounter for screening for diabetes mellitus: Secondary | ICD-10-CM | POA: Diagnosis not present

## 2017-09-06 DIAGNOSIS — R51 Headache: Secondary | ICD-10-CM

## 2017-09-06 DIAGNOSIS — R519 Headache, unspecified: Secondary | ICD-10-CM | POA: Insufficient documentation

## 2017-09-06 DIAGNOSIS — M545 Low back pain, unspecified: Secondary | ICD-10-CM | POA: Insufficient documentation

## 2017-09-06 DIAGNOSIS — Z23 Encounter for immunization: Secondary | ICD-10-CM | POA: Diagnosis not present

## 2017-09-06 DIAGNOSIS — Z0000 Encounter for general adult medical examination without abnormal findings: Secondary | ICD-10-CM

## 2017-09-06 NOTE — Progress Notes (Signed)
Patient: Alyssa Reeves, Female    DOB: 04-20-1981, 36 y.o.   MRN: [id number] Visit Date: 09/06/2017  Today's Provider: Trey Sailors, PA-C   Chief Complaint  Patient presents with  . Establish Care   Subjective:    Annual physical exam Alyssa Reeves is a 36 y.o. female who presents today for health maintenance and complete physical. She feels fairly well. She reports not exercising. She reports she is sleeping well.  Hasn't had a primary care physician, establishing today.   She has been married 13 years, has an 38 year old and a 32 month old. Relationship going well. She is sexually active, no concern for STI. Uses NuvaRing for birth control. Sees Dr. Feliberto Gottron, having an upcoming appointment where she thinks she will get PAP.   Does not smoke, use drugs, or alcohol.   Has about three severe headaches per month. She says she feels they start in her neck and transfer to her head. Describes the pain as tightening across her forehead. Sometimes she can have unilateral throbbing pain associated with photophobia, phonophobia, nausea but no vomiting. She takes excedrin migraine with good relief  Also has been having some low back pain. Localized to her low back, does not travel down her legs. No fevers, chills, weight loss, cancer. No bladder or bowel issues. She has a history of scoliosis surgery with some residual deformity but otherwise this hasn't bothered her since a child. She reports much relief with switching mattresses within her home.   -----------------------------------------------------------------   Review of Systems  Constitutional: Negative.  Negative for activity change.  HENT: Negative.   Eyes: Negative.   Respiratory: Negative.   Cardiovascular: Negative.   Gastrointestinal: Negative.   Endocrine: Negative.   Genitourinary: Negative.   Musculoskeletal: Positive for back pain and neck stiffness.  Skin: Negative.   Allergic/Immunologic:  Negative.   Neurological: Negative.   Hematological: Negative.   Psychiatric/Behavioral: Negative.     Social History      She  reports that she has never smoked. She has never used smokeless tobacco. She reports that she does not drink alcohol or use drugs.       Social History   Social History  . Marital status: Married    Spouse name: N/A  . Number of children: N/A  . Years of education: N/A   Social History Main Topics  . Smoking status: Never Smoker  . Smokeless tobacco: Never Used  . Alcohol use No  . Drug use: No  . Sexual activity: Yes   Other Topics Concern  . None   Social History Narrative  . None    Past Medical History:  Diagnosis Date  . Anxiety    no longer treated for anxiety  . PONV (postoperative nausea and vomiting)   . Pregnancy induced hypertension      Patient Active Problem List   Diagnosis Date Noted  . Previous cesarean delivery affecting pregnancy 07/01/2016  . Post-operative state 07/01/2016  . Non-stress test reactive 06/29/2016    Past Surgical History:  Procedure Laterality Date  . 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 CESAREAN SECTION    . CESAREAN SECTION WITH BILATERAL TUBAL LIGATION N/A 07/01/2016   Procedure: cesarean section;  Surgeon: Suzy Bouchard, MD;  Location: [facility] ORS;  Service: Obstetrics;  Laterality: N/A;  . CHOLECYSTECTOMY  1996    Family History  Family Status  Relation Status  . Mother Alive  . Father Alive  . MGM Alive  . PGM Deceased        Her family history includes Diabetes in her maternal grandmother, mother, and paternal grandmother; Hypertension in her father.     Allergies  Allergen Reactions  . Morphine And Related Anaphylaxis     Current Outpatient Prescriptions:  .  etonogestrel-ethinyl estradiol (NUVARING) 0.12-0.015 MG/24HR vaginal ring, Place vaginally., Disp: , Rfl:  .  clonazePAM (KLONOPIN) 0.5 MG tablet, Take 0.5 mg by mouth 2 (two)  times daily as needed., Disp: , Rfl: 0 .  escitalopram (LEXAPRO) 10 MG tablet, Take 10 mg by mouth daily., Disp: , Rfl: 11   Patient Care Team: Maryella ShiversPollak, Lucas Winograd M, PA-C as PCP - General (Physician Assistant)      Objective:   Vitals: BP 100/64 (BP Location: Right Arm, Patient Position: Sitting, Cuff Size: Normal)   Pulse 88   Temp 98.1 F (36.7 C) (Oral)   Resp 16   Ht 5\' 1"  (1.549 m)   Wt 100 lb (45.4 kg)   LMP 09/04/2017   Breastfeeding? No   BMI 18.89 kg/m    Vitals:   09/06/17 0910  BP: 100/64  Pulse: 88  Resp: 16  Temp: 98.1 F (36.7 C)  TempSrc: Oral  Weight: 100 lb (45.4 kg)  Height: 5\' 1"  (1.549 m)     Physical Exam  Constitutional: She is oriented to person, place, and time. She appears well-developed and well-nourished.  HENT:  Right Ear: Tympanic membrane and external ear normal.  Left Ear: Tympanic membrane and external ear normal.  Mouth/Throat: Oropharynx is clear and moist. No oropharyngeal exudate.  Eyes: Conjunctivae are normal.  Neck: Neck supple.  Cardiovascular: Normal rate and regular rhythm.   Pulmonary/Chest: Effort normal and breath sounds normal.  Neurological: She is alert and oriented to person, place, and time.  Skin: Skin is warm and dry.  Psychiatric: She has a normal mood and affect. Her behavior is normal.     Depression Screen No flowsheet data found.    Assessment & Plan:     Routine Health Maintenance and Physical Exam  Exercise Activities and Dietary recommendations Goals    None      Immunization History  Administered Date(s) Administered  . Influenza,inj,Quad PF,6+ Mos 09/06/2017  . Tdap 04/25/2016    There are no preventive care reminders to display for this patient.   Discussed health benefits of physical activity, and encouraged her to engage in regular exercise appropriate for her age and condition.    1. Annual physical exam   2. Nonintractable headache, unspecified chronicity pattern, unspecified  headache type  OK to keep using Excedrin.  3. Low back pain without sciatica, unspecified back pain laterality, unspecified chronicity  Stretching, find appropriate mattress. Can call back if needing muscle relaxers.  4. Anxiety  Improved on lexapro. Not using klonopin prescribed.  5. Influenza vaccination administered at current visit  - Flu Vaccine QUAD 6+ mos PF IM (Fluarix Quad PF)  6. Diabetes mellitus screening  - Comprehensive Metabolic Panel (CMET)  7. Screening cholesterol level  - Lipid Profile  Return in about 1 year (around 09/06/2018) for CPE.  The entirety of the information documented in the History of Present Illness, Review of Systems and Physical Exam were personally obtained by me. Portions of this information were initially documented by Kavin LeechLaura Walsh, CMA and reviewed by me for thoroughness and accuracy.    --------------------------------------------------------------------  Trinna Post, PA-C  [facility]

## 2017-09-25 LAB — COMPLETE METABOLIC PANEL WITH GFR
AG Ratio: 1.6 (calc) (ref 1.0–2.5)
ALT: 13 U/L (ref 6–29)
AST: 15 U/L (ref 10–30)
Albumin: 4.2 g/dL (ref 3.6–5.1)
Alkaline phosphatase (APISO): 39 U/L (ref 33–115)
BUN: 11 mg/dL (ref 7–25)
CO2: 23 mmol/L (ref 20–32)
Calcium: 8.9 mg/dL (ref 8.6–10.2)
Chloride: 107 mmol/L (ref 98–110)
Creat: 0.67 mg/dL (ref 0.50–1.10)
GFR, Est African American: 129 mL/min/{1.73_m2} (ref >=60)
GFR, Est Non African American: 112 mL/min/{1.73_m2} (ref >=60)
Globulin: 2.6 g/dL (calc) (ref 1.9–3.7)
Glucose, Bld: 91 mg/dL (ref 65–99)
Potassium: 4.1 mmol/L (ref 3.5–5.3)
Sodium: 137 mmol/L (ref 135–146)
Total Bilirubin: 0.5 mg/dL (ref 0.2–1.2)
Total Protein: 6.8 g/dL (ref 6.1–8.1)

## 2017-09-25 LAB — LIPID PANEL
Cholesterol: 134 mg/dL (ref <200)
HDL: 57 mg/dL (ref >50)
LDL Cholesterol (Calc): 63 mg/dL (calc)
Non-HDL Cholesterol (Calc): 77 mg/dL (calc) (ref <130)
Total CHOL/HDL Ratio: 2.4 (calc) (ref <5.0)
Triglycerides: 49 mg/dL (ref <150)

## 2017-10-01 ENCOUNTER — Telehealth: Payer: Self-pay

## 2017-10-01 NOTE — Telephone Encounter (Signed)
Pt advised of lab results.   Thanks,   -Elward Nocera  

## 2018-01-23 ENCOUNTER — Telehealth: Payer: Self-pay | Admitting: Physician Assistant

## 2018-01-23 DIAGNOSIS — M62830 Muscle spasm of back: Secondary | ICD-10-CM

## 2018-01-23 MED ORDER — CYCLOBENZAPRINE HCL 5 MG PO TABS: 5.0000 mg | ORAL_TABLET | Freq: Every day | ORAL | 0 refills | Status: DC | PRN

## 2018-01-23 NOTE — Telephone Encounter (Signed)
Patient states that she in for a office visit and discussed back stiffness and muscle spasms and you gave her some stretching and she has done all of those things and has been doing pretty good until this week and she is having a flare up and you told her to call if this happens and you would send in something for her.  She states that she is taking aleve and has used Deep Blue cream.  She uses [facility].

## 2018-01-23 NOTE — Telephone Encounter (Signed)
Sent in flexeril for back pain, this is a muscle relaxer. It is sedating and so should not be taken before driving. Should be used PRN and not to be taken along side Klonopin if she is still taking this.

## 2018-01-23 NOTE — Telephone Encounter (Signed)
Pt advised.   Thanks,   -Marquette Blodgett  

## 2018-01-23 NOTE — Telephone Encounter (Signed)
Patient advised and verbally voiced understanding.  

## 2018-03-07 ENCOUNTER — Encounter: Payer: Self-pay | Admitting: Physician Assistant

## 2018-03-07 ENCOUNTER — Ambulatory Visit: Payer: BLUE CROSS/BLUE SHIELD | Admitting: Physician Assistant

## 2018-03-07 VITALS — HR 64 | Temp 98.6°F | Resp 16 | Wt 106.0 lb

## 2018-03-07 DIAGNOSIS — G43809 Other migraine, not intractable, without status migrainosus: Secondary | ICD-10-CM

## 2018-03-07 DIAGNOSIS — F419 Anxiety disorder, unspecified: Secondary | ICD-10-CM | POA: Diagnosis not present

## 2018-03-07 MED ORDER — VENLAFAXINE HCL 37.5 MG PO TABS: 37.5000 mg | ORAL_TABLET | Freq: Every day | ORAL | 0 refills | Status: DC

## 2018-03-07 NOTE — Patient Instructions (Addendum)
B2 - 200 mg day Magenesium oxide 400 mg day   Analgesic Rebound Headache An analgesic rebound headache, sometimes called a medication overuse headache, is a headache that comes after pain medicine (analgesic) taken to treat the original (primary) headache has worn off. Any type of primary headache can return as a rebound headache if a person regularly takes analgesics more than three times a week to treat it. The types of primary headaches that are commonly associated with rebound headaches include:  Migraines.  Headaches that arise from tense muscles in the head and neck area (tension headaches).  Headaches that develop and happen again (recur) on one side of the head and around the eye (cluster headaches).  If rebound headaches continue, they become chronic daily headaches. What are the causes? This condition may be caused by frequent use of:  Over-the-counter medicines such as aspirin, ibuprofen, and acetaminophen.  Sinus relief medicines and other medicines that contain caffeine.  Narcotic pain medicines such as codeine and oxycodone.  What are the signs or symptoms? The symptoms of a rebound headache are the same as the symptoms of the original headache. Some of the symptoms of specific types of headaches include: Migraine headache  Pulsing or throbbing pain on one or both sides of the head.  Severe pain that interferes with daily activities.  Pain that is worsened by physical activity.  Nausea, vomiting, or both.  Pain with exposure to bright light, loud noises, or strong smells.  General sensitivity to bright light, loud noises, or strong smells.  Visual changes.  Numbness of one or both arms. Tension headache  Pressure around the head.  Dull, aching head pain.  Pain felt over the front and sides of the head.  Tenderness in the muscles of the head, neck, and shoulders. Cluster headache  Severe pain that begins in or around one eye or temple.  Redness and  tearing in the eye on the same side as the pain.  Droopy or swollen eyelid.  One-sided head pain.  Nausea.  Runny nose.  Sweaty, pale facial skin.  Restlessness. How is this diagnosed? This condition is diagnosed by:  Reviewing your medical history. This includes the nature of your primary headaches.  Reviewing the types of pain medicines that you have been using to treat your headaches and how often you take them.  How is this treated? This condition may be treated or managed by:  Discontinuing frequent use of the analgesic medicine. Doing this may worsen your headaches at first, but the pain should eventually become more manageable, less frequent, and less severe.  Seeing a headache specialist. He or she may be able to help you manage your headaches and help make sure there is not another cause of the headaches.  Using methods of stress relief, such as acupuncture, counseling, biofeedback, and massage. Talk with your health care provider about which methods might be good for you.  Follow these instructions at home:  Take over-the-counter and prescription medicines only as told by your health care provider.  Stop the repeated use of pain medicine as told by your health care provider. Stopping can be difficult. Carefully follow instructions from your health care provider.  Avoid triggers that are known to cause your primary headaches.  Keep all follow-up visits as told by your health care provider. This is important. Contact a health care provider if:  You continue to experience headaches after following treatments that your health care provider recommended. Get help right away if:  You  develop new headache pain.  You develop headache pain that is different than what you have experienced in the past.  You develop numbness or tingling in your arms or legs.  You develop changes in your speech or vision. This information is not intended to replace advice given to you by  your health care provider. Make sure you discuss any questions you have with your health care provider. Document Released: 01/12/2004 Document Revised: 05/11/2016 Document Reviewed: 03/26/2016 Elsevier Interactive Patient Education  [employer].

## 2018-03-07 NOTE — Progress Notes (Signed)
Patient: Alyssa Reeves Female    DOB: [birth year]   37 y.o.   MRN: [id number] Visit Date: 03/07/2018  Today's Provider: Trey Sailors, PA-C   Chief Complaint  Patient presents with  . Headache    Has been going on for years; but worsening in the last month.    Subjective:    Alyssa Reeves is a 37 y/o woman presenting today for worsening headaches and inattention. She has a history of headaches, some she describes as frontal and bitemporal and others she describes as unilateral, throbbing, associated with phonophobia and photophobia. She has been taking excedrin tension H/A 3x per month but most recently she has been taking it every day this past week. She is currently on lexapro for anxiety but is not sure if this is effective, has been having inattention and fogginess lately. No vision change, weakness, worst headaches of her life.   Headache   This is a chronic problem. The problem has been waxing and waning (Pt reports her headaches are becoming more frequent in the last month. ). The pain is located in the bilateral and frontal region. The pain quality is similar to prior headaches. The quality of the pain is described as throbbing. Associated symptoms include blurred vision, nausea, phonophobia, photophobia, sinus pressure and a visual change. Pertinent negatives include no abdominal pain, dizziness, seizures, vomiting or weakness. She has tried Excedrin for the symptoms. The treatment provided moderate relief.       Allergies  Allergen Reactions  . Morphine And Related Anaphylaxis     Current Outpatient Medications:  .  clonazePAM (KLONOPIN) 0.5 MG tablet, Take 0.5 mg by mouth 2 (two) times daily as needed., Disp: , Rfl: 0 .  cyclobenzaprine (FLEXERIL) 5 MG tablet, Take 1 tablet (5 mg total) by mouth daily as needed for muscle spasms., Disp: 30 tablet, Rfl: 0 .  escitalopram (LEXAPRO) 10 MG tablet, Take 10 mg by mouth daily., Disp: , Rfl: 11 .  etonogestrel-ethinyl  estradiol (NUVARING) 0.12-0.015 MG/24HR vaginal ring, Place vaginally., Disp: , Rfl:   Review of Systems  Constitutional: Negative.   HENT: Positive for sinus pressure and sinus pain.   Eyes: Positive for blurred vision and photophobia.  Gastrointestinal: Positive for nausea. Negative for abdominal distention, abdominal pain, anal bleeding, blood in stool, constipation, diarrhea, rectal pain and vomiting.  Musculoskeletal: Negative.   Neurological: Positive for headaches. Negative for dizziness, seizures, syncope, speech difficulty, weakness and light-headedness.    Social History   Tobacco Use  . Smoking status: Never Smoker  . Smokeless tobacco: Never Used  Substance Use Topics  . Alcohol use: No   Objective:   Pulse 64   Temp 98.6 F (37 C) (Oral)   Resp 16   Wt 106 lb (48.1 kg)   BMI 20.03 kg/m  Vitals:   03/07/18 0840  Pulse: 64  Resp: 16  Temp: 98.6 F (37 C)  TempSrc: Oral  Weight: 106 lb (48.1 kg)     Physical Exam  Constitutional: She is oriented to person, place, and time. She appears well-developed and well-nourished.  Cardiovascular: Normal rate and regular rhythm.  Pulmonary/Chest: Effort normal and breath sounds normal.  Neurological: She is alert and oriented to person, place, and time.  Psychiatric: She has a normal mood and affect. Her behavior is normal.        Assessment & Plan:     1. Other migraine without status migrainosus, not intractable  Chronic issue  that is worsening. Will switch lexapro to effexor in hopes of better controlling anxiety and also migraine prophylaxis. Will also try B2 and magnesium oxide. Counseled on limiting excedrin use to avoid rebound headaches. If she would like to pursue formal evaluation of ADHD, we can arrange this.   - venlafaxine (EFFEXOR) 37.5 MG tablet; Take 1 tablet (37.5 mg total) by mouth daily.  Dispense: 90 tablet; Refill: 0  2. Anxiety  Return in about 1 month (around 04/07/2018) for migraines  headaches.  The entirety of the information documented in the History of Present Illness, Review of Systems and Physical Exam were personally obtained by me. Portions of this information were initially documented by Kavin Leech, CMA and reviewed by me for thoroughness and accuracy.          Trey Sailors, PA-C  [facility]

## 2018-04-04 ENCOUNTER — Encounter: Payer: Self-pay | Admitting: Physician Assistant

## 2018-04-04 ENCOUNTER — Ambulatory Visit: Payer: BLUE CROSS/BLUE SHIELD | Admitting: Physician Assistant

## 2018-04-04 VITALS — BP 114/72 | HR 84 | Temp 98.9°F | Resp 16 | Wt 105.0 lb

## 2018-04-04 DIAGNOSIS — F419 Anxiety disorder, unspecified: Secondary | ICD-10-CM | POA: Diagnosis not present

## 2018-04-04 DIAGNOSIS — G43809 Other migraine, not intractable, without status migrainosus: Secondary | ICD-10-CM

## 2018-04-04 DIAGNOSIS — G43909 Migraine, unspecified, not intractable, without status migrainosus: Secondary | ICD-10-CM | POA: Insufficient documentation

## 2018-04-04 NOTE — Patient Instructions (Signed)
Analgesic Rebound Headache An analgesic rebound headache, sometimes called a medication overuse headache, is a headache that comes after pain medicine (analgesic) taken to treat the original (primary) headache has worn off. Any type of primary headache can return as a rebound headache if a person regularly takes analgesics more than three times a week to treat it. The types of primary headaches that are commonly associated with rebound headaches include:  Migraines.  Headaches that arise from tense muscles in the head and neck area (tension headaches).  Headaches that develop and happen again (recur) on one side of the head and around the eye (cluster headaches).  If rebound headaches continue, they become chronic daily headaches. What are the causes? This condition may be caused by frequent use of:  Over-the-counter medicines such as aspirin, ibuprofen, and acetaminophen.  Sinus relief medicines and other medicines that contain caffeine.  Narcotic pain medicines such as codeine and oxycodone.  What are the signs or symptoms? The symptoms of a rebound headache are the same as the symptoms of the original headache. Some of the symptoms of specific types of headaches include: Migraine headache  Pulsing or throbbing pain on one or both sides of the head.  Severe pain that interferes with daily activities.  Pain that is worsened by physical activity.  Nausea, vomiting, or both.  Pain with exposure to bright light, loud noises, or strong smells.  General sensitivity to bright light, loud noises, or strong smells.  Visual changes.  Numbness of one or both arms. Tension headache  Pressure around the head.  Dull, aching head pain.  Pain felt over the front and sides of the head.  Tenderness in the muscles of the head, neck, and shoulders. Cluster headache  Severe pain that begins in or around one eye or temple.  Redness and tearing in the eye on the same side as the  pain.  Droopy or swollen eyelid.  One-sided head pain.  Nausea.  Runny nose.  Sweaty, pale facial skin.  Restlessness. How is this diagnosed? This condition is diagnosed by:  Reviewing your medical history. This includes the nature of your primary headaches.  Reviewing the types of pain medicines that you have been using to treat your headaches and how often you take them.  How is this treated? This condition may be treated or managed by:  Discontinuing frequent use of the analgesic medicine. Doing this may worsen your headaches at first, but the pain should eventually become more manageable, less frequent, and less severe.  Seeing a headache specialist. He or she may be able to help you manage your headaches and help make sure there is not another cause of the headaches.  Using methods of stress relief, such as acupuncture, counseling, biofeedback, and massage. Talk with your health care provider about which methods might be good for you.  Follow these instructions at home:  Take over-the-counter and prescription medicines only as told by your health care provider.  Stop the repeated use of pain medicine as told by your health care provider. Stopping can be difficult. Carefully follow instructions from your health care provider.  Avoid triggers that are known to cause your primary headaches.  Keep all follow-up visits as told by your health care provider. This is important. Contact a health care provider if:  You continue to experience headaches after following treatments that your health care provider recommended. Get help right away if:  You develop new headache pain.  You develop headache pain that is different   than what you have experienced in the past.  You develop numbness or tingling in your arms or legs.  You develop changes in your speech or vision. This information is not intended to replace advice given to you by your health care provider. Make sure you  discuss any questions you have with your health care provider. Document Released: 01/12/2004 Document Revised: 05/11/2016 Document Reviewed: 03/26/2016 Elsevier Interactive Patient Education  2018 [employer]  

## 2018-04-04 NOTE — Progress Notes (Signed)
       Patient: Alyssa MarinerKimberly S Derksen Female    DOB: Aug 17, 1981   37 y.o.   MRN: [id number] Visit Date: 04/04/2018  Today's Provider: Trey SailorsAdriana M Pollak, PA-C   Chief Complaint  Patient presents with  . Migraine    One month follow up   Subjective:    Alyssa Reeves is a 37 y/o woman presenting today for follow up of migraine and anxiety.  Last visit, she was switched from Lexapro to Effexor for both migraine prophylaxis and anxiety. She was also started on B2 and magnesium oxide. She reports significant improvement in this. She also has decreased her usage of Excedrin migraine medication.  She reports topping her NuvaRing a few months ago and some spotting between periods. She is not using any contraception currently, she is open to becoming pregnant. Not currently taking prenatal vitamin.  Migraine   This is a recurrent problem. The problem has been gradually improving. The pain is located in the left unilateral region. The pain radiates to the left neck. The pain quality is similar to prior headaches. The quality of the pain is described as dull and aching. Associated symptoms include sinus pressure. Pertinent negatives include no dizziness.       Allergies  Allergen Reactions  . Morphine And Related Anaphylaxis     Current Outpatient Medications:  .  clonazePAM (KLONOPIN) 0.5 MG tablet, Take 0.5 mg by mouth 2 (two) times daily as needed., Disp: , Rfl: 0 .  cyclobenzaprine (FLEXERIL) 5 MG tablet, Take 1 tablet (5 mg total) by mouth daily as needed for muscle spasms., Disp: 30 tablet, Rfl: 0 .  venlafaxine (EFFEXOR) 37.5 MG tablet, Take 1 tablet (37.5 mg total) by mouth daily., Disp: 90 tablet, Rfl: 0 .  etonogestrel-ethinyl estradiol (NUVARING) 0.12-0.015 MG/24HR vaginal ring, Place vaginally., Disp: , Rfl:   Review of Systems  Constitutional: Negative.   HENT: Positive for sinus pressure and sinus pain.   Eyes: Negative.   Respiratory: Negative.   Cardiovascular: Negative.     Gastrointestinal: Negative.   Neurological: Positive for headaches. Negative for dizziness and light-headedness.    Social History   Tobacco Use  . Smoking status: Never Smoker  . Smokeless tobacco: Never Used  Substance Use Topics  . Alcohol use: No   Objective:   BP 114/72 (BP Location: Right Arm, Patient Position: Sitting, Cuff Size: Normal)   Pulse 84   Temp 98.9 F (37.2 C) (Oral)   Resp 16   Wt 105 lb (47.6 kg)   BMI 19.84 kg/m  Vitals:   04/04/18 0846  BP: 114/72  Pulse: 84  Resp: 16  Temp: 98.9 F (37.2 C)  TempSrc: Oral  Weight: 105 lb (47.6 kg)     Physical Exam      Assessment & Plan:     1. Anxiety  Improved, continue effexor.  2. Other migraine without status migrainosus, not intractable  Improved, continue effexor and vitamins. Take prenatal vitamin. Will hold off on imitrex due to possibly trying to conceive.   Return in about 6 months (around 10/04/2018) for headaches .  The entirety of the information documented in the History of Present Illness, Review of Systems and Physical Exam were personally obtained by me. Portions of this information were initially documented by Kavin LeechLaura Walsh, CMA and reviewed by me for thoroughness and accuracy.        Trey SailorsAdriana M Pollak, PA-C  [facility]

## 2018-04-11 ENCOUNTER — Ambulatory Visit: Payer: BLUE CROSS/BLUE SHIELD | Admitting: Physician Assistant

## 2018-05-15 ENCOUNTER — Telehealth: Payer: Self-pay | Admitting: Physician Assistant

## 2018-05-15 NOTE — Telephone Encounter (Signed)
LMTCB 05/15/2018   Thanks,   -Vernona RiegerLaura

## 2018-05-15 NOTE — Telephone Encounter (Signed)
It could possibly be a rare side effect, but I'm not sure that's the first thing I would attribute it to. I know she had recently discontinued her birth control, has she restarted this? Would take a pregnancy test if she has not already and then talk to her gynecologist.

## 2018-05-15 NOTE — Telephone Encounter (Signed)
Pt advised as directed below.   Thanks,   -Laura  

## 2018-05-15 NOTE — Telephone Encounter (Signed)
Pt stated that she started taking venlafaxine (EFFEXOR) 37.5 MG tablet about 10 weeks ago. Pt stated that she since then she hasn't had a menstrual cycle and was asking if this could be a side effect. Pt stated that she isn't on any birth control and she isn't pregnant. Please advise. Thanks TNP

## 2018-06-14 ENCOUNTER — Other Ambulatory Visit: Payer: Self-pay | Admitting: Physician Assistant

## 2018-06-14 DIAGNOSIS — G43809 Other migraine, not intractable, without status migrainosus: Secondary | ICD-10-CM

## 2018-09-15 ENCOUNTER — Encounter: Payer: Self-pay | Admitting: Physician Assistant

## 2018-09-15 ENCOUNTER — Ambulatory Visit: Payer: BLUE CROSS/BLUE SHIELD | Admitting: Physician Assistant

## 2018-09-15 ENCOUNTER — Other Ambulatory Visit: Payer: Self-pay | Admitting: Physician Assistant

## 2018-09-15 VITALS — BP 128/83 | HR 80 | Temp 97.9°F | Wt 113.9 lb

## 2018-09-15 DIAGNOSIS — B029 Zoster without complications: Secondary | ICD-10-CM

## 2018-09-15 DIAGNOSIS — G43809 Other migraine, not intractable, without status migrainosus: Secondary | ICD-10-CM

## 2018-09-15 MED ORDER — VALACYCLOVIR HCL 1 G PO TABS: 1000.0000 mg | ORAL_TABLET | Freq: Three times a day (TID) | ORAL | 0 refills | Status: AC

## 2018-09-15 NOTE — Progress Notes (Signed)
       Patient: Alyssa Reeves Female    DOB: Dec 26, 1980   37 y.o.   MRN: [id number] Visit Date: 09/15/2018  Today's Provider: Trey Sailors, PA-C   Chief Complaint  Patient presents with  . Rash   Subjective:    HPI Rash Patient present today for rash on left side for about 1 week now. Patient states that the rash is painful and itchy. Patient states she is also having pain in her left shoulder down to her mid back. She was having pain and itching in the area where rash is before the rash. She has had chickenpox when she was a child.     Allergies  Allergen Reactions  . Morphine And Related Anaphylaxis     Current Outpatient Medications:  .  clonazePAM (KLONOPIN) 0.5 MG tablet, Take 0.5 mg by mouth 2 (two) times daily as needed., Disp: , Rfl: 0 .  cyclobenzaprine (FLEXERIL) 5 MG tablet, Take 1 tablet (5 mg total) by mouth daily as needed for muscle spasms., Disp: 30 tablet, Rfl: 0 .  venlafaxine (EFFEXOR) 37.5 MG tablet, TAKE ONE TABLET BY MOUTH EVERY DAY, Disp: 90 tablet, Rfl: 0  Review of Systems  Constitutional: Negative.   HENT: Negative.   Eyes: Negative.   Respiratory: Negative.   Cardiovascular: Negative.   Gastrointestinal: Negative.   Genitourinary: Negative.   Musculoskeletal: Negative.   Skin: Positive for rash.  Allergic/Immunologic: Negative.   Neurological: Negative.     Social History   Tobacco Use  . Smoking status: Never Smoker  . Smokeless tobacco: Never Used  Substance Use Topics  . Alcohol use: No   Objective:   BP 128/83 (BP Location: Left Arm, Patient Position: Sitting, Cuff Size: Normal)   Pulse 80   Temp 97.9 F (36.6 C) (Oral)   Wt 113 lb 14.4 oz (51.7 kg)   LMP 08/18/2018   SpO2 98%   BMI 21.52 kg/m  Vitals:   09/15/18 1559  BP: 128/83  Pulse: 80  Temp: 97.9 F (36.6 C)  TempSrc: Oral  SpO2: 98%  Weight: 113 lb 14.4 oz (51.7 kg)     Physical Exam  Constitutional: She is oriented to person, place, and time. She  appears well-developed and well-nourished.  Cardiovascular: Normal rate.  Pulmonary/Chest: Effort normal.  Neurological: She is alert and oriented to person, place, and time.  Skin: Skin is warm and dry. Rash noted.     Clusters of vesicle on erythematous base in dermatomal pattern.   Psychiatric: She has a normal mood and affect. Her behavior is normal.        Assessment & Plan:     1. Herpes zoster without complication  Advised on contact precautions, treat as below. She is not actively preventing pregnancy, reassured about safety of medication in pregnancy.   - valACYclovir (VALTREX) 1000 MG tablet; Take 1 tablet (1,000 mg total) by mouth 3 (three) times daily for 7 days.  Dispense: 21 tablet; Refill: 0  Return if symptoms worsen or fail to improve.  The entirety of the information documented in the History of Present Illness, Review of Systems and Physical Exam were personally obtained by me. Portions of this information were initially documented by Dara Lords, CMA and reviewed by me for thoroughness and accuracy.            Trey Sailors, PA-C  [facility]

## 2018-09-15 NOTE — Patient Instructions (Signed)

## 2018-10-10 ENCOUNTER — Ambulatory Visit: Payer: Self-pay | Admitting: Physician Assistant

## 2018-10-23 ENCOUNTER — Telehealth: Payer: Self-pay

## 2018-10-23 DIAGNOSIS — B0229 Other postherpetic nervous system involvement: Secondary | ICD-10-CM

## 2018-10-23 MED ORDER — GABAPENTIN 300 MG PO CAPS: ORAL_CAPSULE | ORAL | 0 refills | Status: DC

## 2018-10-23 NOTE — Telephone Encounter (Signed)
Patient called office and states that she was seen several weeks ago for shingles and states that in her left arm she has had tingling and sensitivity to touch. Patient states this is same side she had shingles outbreak  and is wondering if there is anything she can do? KW

## 2018-10-23 NOTE — Telephone Encounter (Signed)
Patient advised.

## 2018-10-23 NOTE — Telephone Encounter (Signed)
Patient was advise. Patient states it is the same symptoms she had with the shingles before and she has put different creams/ lotion on the area and it was not getting any better. Patient states it is fine to send in the Gabapentin 300mg  to [facility].

## 2018-10-23 NOTE — Telephone Encounter (Signed)
It's a bit hard for me to tell over the phone if this is related to shingles or another issue. Sometimes after shingles disappears, people can have "nerve" symptoms that still persist. There is a medication called gabapentin which can be helpful and I can send it in for her if she'd like. She can start with 300 mg nightly and increase slowly to three times a day. This may make her drowsy and she should see how if affects her before she drives.

## 2018-11-14 ENCOUNTER — Encounter: Payer: Self-pay | Admitting: Physician Assistant

## 2018-11-14 ENCOUNTER — Ambulatory Visit (INDEPENDENT_AMBULATORY_CARE_PROVIDER_SITE_OTHER): Payer: BLUE CROSS/BLUE SHIELD | Admitting: Physician Assistant

## 2018-11-14 VITALS — BP 127/84 | HR 85 | Temp 97.8°F | Wt 112.4 lb

## 2018-11-14 DIAGNOSIS — G43809 Other migraine, not intractable, without status migrainosus: Secondary | ICD-10-CM

## 2018-11-14 DIAGNOSIS — F419 Anxiety disorder, unspecified: Secondary | ICD-10-CM

## 2018-11-14 DIAGNOSIS — G629 Polyneuropathy, unspecified: Secondary | ICD-10-CM

## 2018-11-14 MED ORDER — HYDROXYZINE HCL 10 MG PO TABS: 10.0000 mg | ORAL_TABLET | Freq: Every day | ORAL | 0 refills | Status: DC | PRN

## 2018-11-14 MED ORDER — PREGABALIN 75 MG PO CAPS: 75.0000 mg | ORAL_CAPSULE | Freq: Two times a day (BID) | ORAL | 0 refills | Status: DC

## 2018-11-14 MED ORDER — PREDNISONE 10 MG (21) PO TBPK: ORAL_TABLET | ORAL | 0 refills | Status: DC

## 2018-11-14 NOTE — Patient Instructions (Signed)
Radicular Pain Radicular pain is a type of pain that spreads from your back or neck along a spinal nerve. Spinal nerves are nerves that leave the spinal cord and go to the muscles. Radicular pain is sometimes called radiculopathy, radiculitis, or a pinched nerve. When you have this type of pain, you may also have weakness, numbness, or tingling in the area of your body that is supplied by the nerve. The pain may feel sharp and burning. Depending on which spinal nerve is affected, the pain may occur in the:  Neck area (cervical radicular pain). You may also feel pain, numbness, weakness, or tingling in the arms.  Mid-spine area (thoracic radicular pain). You would feel this pain in the back and chest. This type is rare.  Lower back area (lumbar radicular pain). You would feel this pain as low back pain. You may feel pain, numbness, weakness, or tingling in the buttocks or legs. Sciatica is a type of lumbar radicular pain that shoots down the back of the leg. Radicular pain occurs when one of the spinal nerves becomes irritated or squeezed (compressed). It is often caused by something pushing on a spinal nerve, such as one of the bones of the spine (vertebrae) or one of the round cushions between vertebrae (intervertebral disks). This can result from:  An injury.  Wear and tear or aging of a disk.  The growth of a bone spur that pushes on the nerve. Radicular pain often goes away when you follow instructions from your health care provider for relieving pain at home. Follow these instructions at home: Managing pain      If directed, put ice on the affected area: ? Put ice in a plastic bag. ? Place a towel between your skin and the bag. ? Leave the ice on for 20 minutes, 2-3 times a day.  If directed, apply heat to the affected area as often as told by your health care provider. Use the heat source that your health care provider recommends, such as a moist heat pack or a heating pad. ? Place  a towel between your skin and the heat source. ? Leave the heat on for 20-30 minutes. ? Remove the heat if your skin turns bright red. This is especially important if you are unable to feel pain, heat, or cold. You may have a greater risk of getting burned. Activity   Do not sit or rest in bed for long periods of time.  Try to stay as active as possible. Ask your health care provider what type of exercise or activity is best for you.  Avoid activities that make your pain worse, such as bending and lifting.  Do not lift anything that is heavier than 10 lb (4.5 kg), or the limit that you are told, until your health care provider says that it is safe.  Practice using proper technique when lifting items. Proper lifting technique involves bending your knees and rising up.  Do strength and range-of-motion exercises only as told by your health care provider or physical therapist. General instructions  Take over-the-counter and prescription medicines only as told by your health care provider.  Pay attention to any changes in your symptoms.  Keep all follow-up visits as told by your health care provider. This is important. ? Your health care provider may send you to a physical therapist to help with this pain. Contact a health care provider if:  Your pain and other symptoms get worse.  Your pain medicine is not   helping.  Your pain has not improved after a few weeks of home care.  You have a fever. Get help right away if:  You have severe pain, weakness, or numbness.  You have difficulty with bladder or bowel control. Summary  Radicular pain is a type of pain that spreads from your back or neck along a spinal nerve.  When you have radicular pain, you may also have weakness, numbness, or tingling in the area of your body that is supplied by the nerve.  The pain may feel sharp or burning.  Radicular pain may be treated with ice, heat, medicines, or physical therapy. This  information is not intended to replace advice given to you by your health care provider. Make sure you discuss any questions you have with your health care provider. Document Released: 11/29/2004 Document Revised: 05/06/2018 Document Reviewed: 05/06/2018 Elsevier Interactive Patient Education  2019 Elsevier Inc.  

## 2018-11-14 NOTE — Progress Notes (Signed)
Patient: Alyssa Reeves Female    DOB: 1981/04/07   38 y.o.   MRN: [id number] Visit Date: 11/18/2018  Today's Provider: Trey Sailors, PA-C   Chief Complaint  Patient presents with  . Anxiety  . Migraine   Subjective:    Anxiety  Presents for follow-up visit. Symptoms include decreased concentration. Patient reports no chest pain, depressed mood or shortness of breath. The quality of sleep is good. Nighttime awakenings: occasional.   Compliance with medications is 76-100%.  Migraine   This is a recurrent problem. The problem has been rapidly improving. The patient is experiencing no pain. The treatment provided significant relief. Her past medical history is significant for migraine headaches.   Reports she is having a burning sensation in her left arm. She reports it feels as if she wants to itch her arm. She first related this to having shingles but her shingles was on her left sided chest area and this pain is in her left arm. She had a trial of gabapentin which was ineffective. She has never had neck injury. Shoulder does not hurt and she does not have issues with overhead motion or back pocket motions.   Does request Prn anxiety medication. Previously used klonopin.   Wt Readings from Last 3 Encounters:  11/14/18 112 lb 6.4 oz (51 kg)  09/15/18 113 lb 14.4 oz (51.7 kg)  04/04/18 105 lb (47.6 kg)         Allergies  Allergen Reactions  . Morphine And Related Anaphylaxis     Current Outpatient Medications:  .  clonazePAM (KLONOPIN) 0.5 MG tablet, Take 0.5 mg by mouth 2 (two) times daily as needed., Disp: , Rfl: 0 .  cyclobenzaprine (FLEXERIL) 5 MG tablet, Take 1 tablet (5 mg total) by mouth daily as needed for muscle spasms., Disp: 30 tablet, Rfl: 0 .  venlafaxine (EFFEXOR) 37.5 MG tablet, TAKE ONE TABLET EVERY DAY, Disp: 90 tablet, Rfl: 0 .  hydrOXYzine (ATARAX/VISTARIL) 10 MG tablet, Take 1 tablet (10 mg total) by mouth daily as needed., Disp: 30 tablet,  Rfl: 0 .  predniSONE (STERAPRED UNI-PAK 21 TAB) 10 MG (21) TBPK tablet, Take 6 pills on day 1, take 5 pills on day 2, and so on until complete., Disp: 21 tablet, Rfl: 0 .  pregabalin (LYRICA) 75 MG capsule, Take 1 capsule (75 mg total) by mouth 2 (two) times daily., Disp: 180 capsule, Rfl: 0  Review of Systems  Constitutional: Negative.   HENT: Negative.   Respiratory: Negative.  Negative for shortness of breath.   Cardiovascular: Negative for chest pain.  Genitourinary: Negative.   Neurological: Negative.   Psychiatric/Behavioral: Positive for decreased concentration.    Social History   Tobacco Use  . Smoking status: Never Smoker  . Smokeless tobacco: Never Used  Substance Use Topics  . Alcohol use: No      Objective:   BP 127/84 (BP Location: Left Arm, Patient Position: Sitting, Cuff Size: Normal)   Pulse 85   Temp 97.8 F (36.6 C) (Oral)   Wt 112 lb 6.4 oz (51 kg)   LMP 11/14/2018 (Exact Date)   SpO2 99%   BMI 21.24 kg/m  Vitals:   11/14/18 0825  BP: 127/84  Pulse: 85  Temp: 97.8 F (36.6 C)  TempSrc: Oral  SpO2: 99%  Weight: 112 lb 6.4 oz (51 kg)     Physical Exam Constitutional:      Appearance: Normal appearance.  Neck:  Musculoskeletal: Normal range of motion and neck supple.  Cardiovascular:     Rate and Rhythm: Normal rate and regular rhythm.  Pulmonary:     Effort: Pulmonary effort is normal.     Breath sounds: Normal breath sounds.  Musculoskeletal: Normal range of motion.     Right shoulder: Normal.     Left shoulder: She exhibits pain. She exhibits normal range of motion, no tenderness, no bony tenderness, no swelling, no effusion, no crepitus, no deformity, no laceration, no spasm, normal pulse and normal strength.     Comments: Impingement signs negative bilaterally. Negative spurling maneuver.   Neurological:     General: No focal deficit present.     Mental Status: She is alert and oriented to person, place, and time.  Psychiatric:         Mood and Affect: Mood normal.        Behavior: Behavior normal.         Assessment & Plan    1. Neuropathy  Worsening pain. Will try treatment as below, could be some cervical radiculopathy or other neuropathy. Does not seem to be post herpetic neuralgia as it is not in the dermatome of her shingles.  - predniSONE (STERAPRED UNI-PAK 21 TAB) 10 MG (21) TBPK tablet; Take 6 pills on day 1, take 5 pills on day 2, and so on until complete.  Dispense: 21 tablet; Refill: 0 - pregabalin (LYRICA) 75 MG capsule; Take 1 capsule (75 mg total) by mouth 2 (two) times daily.  Dispense: 180 capsule; Refill: 0  2. Anxiety  Continue effexor, add hydroxyzine.   - hydrOXYzine (ATARAX/VISTARIL) 10 MG tablet; Take 1 tablet (10 mg total) by mouth daily as needed.  Dispense: 30 tablet; Refill: 0  3. Other migraine without status migrainosus, not intractable  Continue effexor.   Return in about 6 months (around 05/15/2019) for anxiety, migrianes, arm pain .  The entirety of the information documented in the History of Present Illness, Review of Systems and Physical Exam were personally obtained by me. Portions of this information were initially documented by Rondel BatonSulibeya Dimas, CMA and reviewed by me for thoroughness and accuracy.       Trey SailorsAdriana M Pollak, PA-C  [facility]

## 2018-11-28 ENCOUNTER — Other Ambulatory Visit: Payer: Self-pay | Admitting: Obstetrics and Gynecology

## 2018-11-28 DIAGNOSIS — Z01419 Encounter for gynecological examination (general) (routine) without abnormal findings: Secondary | ICD-10-CM | POA: Diagnosis not present

## 2018-11-28 DIAGNOSIS — Z1231 Encounter for screening mammogram for malignant neoplasm of breast: Secondary | ICD-10-CM

## 2018-11-28 DIAGNOSIS — Z124 Encounter for screening for malignant neoplasm of cervix: Secondary | ICD-10-CM | POA: Diagnosis not present

## 2018-11-28 LAB — HM PAP SMEAR: HM Pap smear: NEGATIVE

## 2018-12-05 DIAGNOSIS — Z01419 Encounter for gynecological examination (general) (routine) without abnormal findings: Secondary | ICD-10-CM | POA: Diagnosis not present

## 2018-12-12 DIAGNOSIS — L910 Hypertrophic scar: Secondary | ICD-10-CM | POA: Diagnosis not present

## 2018-12-29 ENCOUNTER — Other Ambulatory Visit: Payer: Self-pay | Admitting: Physician Assistant

## 2018-12-29 DIAGNOSIS — G43809 Other migraine, not intractable, without status migrainosus: Secondary | ICD-10-CM

## 2018-12-29 NOTE — Telephone Encounter (Signed)
Pt called regarding the effexor 37.5 refill.  Wanting to know if we could fill it today.  She is out.  Thanks [employer]

## 2019-01-09 ENCOUNTER — Ambulatory Visit
Admission: RE | Admit: 2019-01-09 | Discharge: 2019-01-09 | Disposition: A | Discharge status: Home / Self Care | Payer: 59 | Source: Ambulatory Visit | Attending: Obstetrics and Gynecology | Admitting: Obstetrics and Gynecology

## 2019-01-09 DIAGNOSIS — Z1231 Encounter for screening mammogram for malignant neoplasm of breast: Secondary | ICD-10-CM | POA: Diagnosis not present

## 2019-01-16 DIAGNOSIS — L910 Hypertrophic scar: Secondary | ICD-10-CM | POA: Diagnosis not present

## 2019-02-20 ENCOUNTER — Ambulatory Visit (INDEPENDENT_AMBULATORY_CARE_PROVIDER_SITE_OTHER): Payer: 59 | Admitting: Physician Assistant

## 2019-02-20 DIAGNOSIS — M79602 Pain in left arm: Secondary | ICD-10-CM

## 2019-02-20 DIAGNOSIS — M62830 Muscle spasm of back: Secondary | ICD-10-CM | POA: Diagnosis not present

## 2019-02-20 MED ORDER — MELOXICAM 7.5 MG PO TABS: 7.5000 mg | ORAL_TABLET | Freq: Every day | ORAL | 0 refills | Status: DC

## 2019-02-20 MED ORDER — CYCLOBENZAPRINE HCL 5 MG PO TABS: 5.0000 mg | ORAL_TABLET | Freq: Every day | ORAL | 0 refills | Status: DC | PRN

## 2019-02-20 NOTE — Patient Instructions (Signed)
Radicular Pain Radicular pain is a type of pain that spreads from your back or neck along a spinal nerve. Spinal nerves are nerves that leave the spinal cord and go to the muscles. Radicular pain is sometimes called radiculopathy, radiculitis, or a pinched nerve. When you have this type of pain, you may also have weakness, numbness, or tingling in the area of your body that is supplied by the nerve. The pain may feel sharp and burning. Depending on which spinal nerve is affected, the pain may occur in the:  Neck area (cervical radicular pain). You may also feel pain, numbness, weakness, or tingling in the arms.  Mid-spine area (thoracic radicular pain). You would feel this pain in the back and chest. This type is rare.  Lower back area (lumbar radicular pain). You would feel this pain as low back pain. You may feel pain, numbness, weakness, or tingling in the buttocks or legs. Sciatica is a type of lumbar radicular pain that shoots down the back of the leg. Radicular pain occurs when one of the spinal nerves becomes irritated or squeezed (compressed). It is often caused by something pushing on a spinal nerve, such as one of the bones of the spine (vertebrae) or one of the round cushions between vertebrae (intervertebral disks). This can result from:  An injury.  Wear and tear or aging of a disk.  The growth of a bone spur that pushes on the nerve. Radicular pain often goes away when you follow instructions from your health care provider for relieving pain at home. Follow these instructions at home: Managing pain      If directed, put ice on the affected area: ? Put ice in a plastic bag. ? Place a towel between your skin and the bag. ? Leave the ice on for 20 minutes, 2-3 times a day.  If directed, apply heat to the affected area as often as told by your health care provider. Use the heat source that your health care provider recommends, such as a moist heat pack or a heating pad. ? Place  a towel between your skin and the heat source. ? Leave the heat on for 20-30 minutes. ? Remove the heat if your skin turns bright red. This is especially important if you are unable to feel pain, heat, or cold. You may have a greater risk of getting burned. Activity   Do not sit or rest in bed for long periods of time.  Try to stay as active as possible. Ask your health care provider what type of exercise or activity is best for you.  Avoid activities that make your pain worse, such as bending and lifting.  Do not lift anything that is heavier than 10 lb (4.5 kg), or the limit that you are told, until your health care provider says that it is safe.  Practice using proper technique when lifting items. Proper lifting technique involves bending your knees and rising up.  Do strength and range-of-motion exercises only as told by your health care provider or physical therapist. General instructions  Take over-the-counter and prescription medicines only as told by your health care provider.  Pay attention to any changes in your symptoms.  Keep all follow-up visits as told by your health care provider. This is important. ? Your health care provider may send you to a physical therapist to help with this pain. Contact a health care provider if:  Your pain and other symptoms get worse.  Your pain medicine is not   helping.  Your pain has not improved after a few weeks of home care.  You have a fever. Get help right away if:  You have severe pain, weakness, or numbness.  You have difficulty with bladder or bowel control. Summary  Radicular pain is a type of pain that spreads from your back or neck along a spinal nerve.  When you have radicular pain, you may also have weakness, numbness, or tingling in the area of your body that is supplied by the nerve.  The pain may feel sharp or burning.  Radicular pain may be treated with ice, heat, medicines, or physical therapy. This  information is not intended to replace advice given to you by your health care provider. Make sure you discuss any questions you have with your health care provider. Document Released: 11/29/2004 Document Revised: 05/06/2018 Document Reviewed: 05/06/2018 Elsevier Interactive Patient Education  2019 Elsevier Inc.  

## 2019-02-20 NOTE — Progress Notes (Signed)
Patient: Alyssa Reeves Female    DOB: 21-Dec-1980   38 y.o.   MRN: [id number] Visit Date: 02/20/2019  Today's Provider: Trey SailorsAdriana M Pollak, PA-C   No chief complaint on file.  Subjective:    Virtual Visit via Video Note  I connected with Alyssa Reeves on 02/24/19 at  9:20 AM EDT by a video enabled telemedicine application and verified that I am speaking with the correct person using two identifiers.   I discussed the limitations of evaluation and management by telemedicine and the availability of in person appointments. The patient expressed understanding and agreed to proceed.  HPI   Patient presents today for 3 month follow up of left arm pain. She was treated last time for cervical radiculopathy with 6 day 10 mg predinsone taper and also Lyrica 75 mg BID. She reports she took the prednisone which provided such significant relief that she didn't need the Lyrica. Her arm pain had really subsided until just today when it has flared back up. Continues to radiate from her neck and into her left arm and shoulder. She denies difficulty with overhead motion, putting on a jacket, or back pocket motions. She denies loss of grip strength.   She is doing well on effexor 37.mg daily.    Allergies  Allergen Reactions  . Morphine And Related Anaphylaxis     Current Outpatient Medications:  .  clonazePAM (KLONOPIN) 0.5 MG tablet, Take 0.5 mg by mouth 2 (two) times daily as needed., Disp: , Rfl: 0 .  cyclobenzaprine (FLEXERIL) 5 MG tablet, Take 1 tablet (5 mg total) by mouth daily as needed for muscle spasms., Disp: 30 tablet, Rfl: 0 .  hydrOXYzine (ATARAX/VISTARIL) 10 MG tablet, Take 1 tablet (10 mg total) by mouth daily as needed., Disp: 30 tablet, Rfl: 0 .  predniSONE (STERAPRED UNI-PAK 21 TAB) 10 MG (21) TBPK tablet, Take 6 pills on day 1, take 5 pills on day 2, and so on until complete., Disp: 21 tablet, Rfl: 0 .  pregabalin (LYRICA) 75 MG capsule, Take 1 capsule (75 mg total) by  mouth 2 (two) times daily., Disp: 180 capsule, Rfl: 0 .  venlafaxine (EFFEXOR) 37.5 MG tablet, TAKE ONE TABLET EVERY DAY, Disp: 90 tablet, Rfl: 1  Review of Systems  All other systems reviewed and are negative.   Social History   Tobacco Use  . Smoking status: Never Smoker  . Smokeless tobacco: Never Used  Substance Use Topics  . Alcohol use: No      Objective:   There were no vitals taken for this visit. There were no vitals filed for this visit.   Physical Exam Constitutional:      Appearance: Normal appearance.  Pulmonary:     Effort: Pulmonary effort is normal. No respiratory distress.  Neurological:     Mental Status: She is alert. Mental status is at baseline.  Psychiatric:        Mood and Affect: Mood normal.        Behavior: Behavior normal.         Assessment & Plan    1. Pain of left upper extremity  Arm pain had really subsided until the last few days. May take Mobic PRN for inflammation, can use Lyrica if she feels she needs this. She is a Sales executivedental assistant and may have some underlying MSK dysfunction due to repetitive motions and positioning. She may benefit from some physical therapy in the future. She will let me  know if she wants to complete this.  - meloxicam (MOBIC) 7.5 MG tablet; Take 1 tablet (7.5 mg total) by mouth daily.  Dispense: 30 tablet; Refill: 0  2. Back spasm  - cyclobenzaprine (FLEXERIL) 5 MG tablet; Take 1 tablet (5 mg total) by mouth daily as needed for muscle spasms.  Dispense: 30 tablet; Refill: 0  The entirety of the information documented in the History of Present Illness, Review of Systems and Physical Exam were personally obtained by me. Portions of this information were initially documented by Lexine Baton, LPN and reviewed by me for thoroughness and accuracy.   F/u 1 year   Patient location: home Provider location: [facility]/home office  Persons involved in the visit: patient, provider          Trey Sailors, PA-C  [facility]

## 2019-03-18 DIAGNOSIS — R202 Paresthesia of skin: Secondary | ICD-10-CM | POA: Diagnosis not present

## 2019-04-07 ENCOUNTER — Encounter: Payer: Self-pay | Admitting: Physician Assistant

## 2019-04-07 ENCOUNTER — Other Ambulatory Visit: Payer: Self-pay | Admitting: Physician Assistant

## 2019-04-07 DIAGNOSIS — M5412 Radiculopathy, cervical region: Secondary | ICD-10-CM

## 2019-04-07 MED ORDER — GABAPENTIN 300 MG PO CAPS: 300.0000 mg | ORAL_CAPSULE | Freq: Two times a day (BID) | ORAL | 1 refills | Status: DC

## 2019-06-08 ENCOUNTER — Other Ambulatory Visit: Payer: Self-pay | Admitting: Physician Assistant

## 2019-06-08 DIAGNOSIS — G43809 Other migraine, not intractable, without status migrainosus: Secondary | ICD-10-CM

## 2019-06-12 ENCOUNTER — Other Ambulatory Visit: Payer: Self-pay

## 2019-06-12 ENCOUNTER — Ambulatory Visit: Payer: 59 | Admitting: Physician Assistant

## 2019-06-12 ENCOUNTER — Encounter: Payer: Self-pay | Admitting: Physician Assistant

## 2019-06-12 VITALS — BP 113/79 | HR 96 | Temp 98.6°F | Resp 16 | Ht 61.0 in | Wt 115.0 lb

## 2019-06-12 DIAGNOSIS — G5623 Lesion of ulnar nerve, bilateral upper limbs: Secondary | ICD-10-CM | POA: Diagnosis not present

## 2019-06-12 DIAGNOSIS — G5603 Carpal tunnel syndrome, bilateral upper limbs: Secondary | ICD-10-CM | POA: Diagnosis not present

## 2019-06-12 DIAGNOSIS — M79602 Pain in left arm: Secondary | ICD-10-CM | POA: Diagnosis not present

## 2019-06-12 DIAGNOSIS — J45909 Unspecified asthma, uncomplicated: Secondary | ICD-10-CM | POA: Insufficient documentation

## 2019-06-12 MED ORDER — MELOXICAM 7.5 MG PO TABS: 7.5000 mg | ORAL_TABLET | Freq: Every day | ORAL | 0 refills | Status: DC

## 2019-06-12 NOTE — Patient Instructions (Signed)
Carpal Tunnel Syndrome  Carpal tunnel syndrome is a condition that causes pain in your hand and arm. The carpal tunnel is a narrow area that is on the palm side of your wrist. Repeated wrist motion or certain diseases may cause swelling in the tunnel. This swelling can pinch the main nerve in the wrist (median nerve). What are the causes? This condition may be caused by:  Repeated wrist motions.  Wrist injuries.  Arthritis.  A sac of fluid (cyst) or abnormal growth (tumor) in the carpal tunnel.  Fluid buildup during pregnancy. Sometimes the cause is not known. What increases the risk? The following factors may make you more likely to develop this condition:  Having a job in which you move your wrist in the same way many times. This includes jobs like being a butcher or a cashier.  Being a woman.  Having other health conditions, such as: ? Diabetes. ? Obesity. ? A thyroid gland that is not active enough (hypothyroidism). ? Kidney failure. What are the signs or symptoms? Symptoms of this condition include:  A tingling feeling in your fingers.  Tingling or a loss of feeling (numbness) in your hand.  Pain in your entire arm. This pain may get worse when you bend your wrist and elbow for a long time.  Pain in your wrist that goes up your arm to your shoulder.  Pain that goes down into your palm or fingers.  A weak feeling in your hands. You may find it hard to grab and hold items. You may feel worse at night. How is this diagnosed? This condition is diagnosed with a medical history and physical exam. You may also have tests, such as:  Electromyogram (EMG). This test checks the signals that the nerves send to the muscles.  Nerve conduction study. This test checks how well signals pass through your nerves.  Imaging tests, such as X-rays, ultrasound, and MRI. These tests check for what might be the cause of your condition. How is this treated? This condition may be treated  with:  Lifestyle changes. You will be asked to stop or change the activity that caused your problem.  Doing exercise and activities that make bones and muscles stronger (physical therapy).  Learning how to use your hand again (occupational therapy).  Medicines for pain and swelling (inflammation). You may have injections in your wrist.  A wrist splint.  Surgery. Follow these instructions at home: If you have a splint:  Wear the splint as told by your doctor. Remove it only as told by your doctor.  Loosen the splint if your fingers: ? Tingle. ? Lose feeling (become numb). ? Turn cold and blue.  Keep the splint clean.  If the splint is not waterproof: ? Do not let it get wet. ? Cover it with a watertight covering when you take a bath or a shower. Managing pain, stiffness, and swelling   If told, put ice on the painful area: ? If you have a removable splint, remove it as told by your doctor. ? Put ice in a plastic bag. ? Place a towel between your skin and the bag. ? Leave the ice on for 20 minutes, 2-3 times per day. General instructions  Take over-the-counter and prescription medicines only as told by your doctor.  Rest your wrist from any activity that may cause pain. If needed, talk with your boss at work about changes that can help your wrist heal.  Do any exercises as told by your doctor,   physical therapist, or occupational therapist.  Keep all follow-up visits as told by your doctor. This is important. Contact a doctor if:  You have new symptoms.  Medicine does not help your pain.  Your symptoms get worse. Get help right away if:  You have very bad numbness or tingling in your wrist or hand. Summary  Carpal tunnel syndrome is a condition that causes pain in your hand and arm.  It is often caused by repeated wrist motions.  Lifestyle changes and medicines are used to treat this problem. Surgery may help in very bad cases.  Follow your doctor's  instructions about wearing a splint, resting your wrist, keeping follow-up visits, and calling for help. This information is not intended to replace advice given to you by your health care provider. Make sure you discuss any questions you have with your health care provider. Document Released: 10/11/2011 Document Revised: 02/28/2018 Document Reviewed: 02/28/2018 Elsevier Patient Education  2020 Elsevier Inc.  

## 2019-06-12 NOTE — Progress Notes (Signed)
Patient: Alyssa Reeves Female    DOB: [birth year]   38 y.o.   MRN: [id number] Visit Date: 06/12/2019  Today's Provider: Trinna Post, PA-C   Chief Complaint  Patient presents with  . Joint Pain   Subjective:     HPI   Patient presents today C/O joint stiffness/tingling in shoulders and arms. Patient was seen 02/20/2019 for pain in upper extremity. She feels like the pain is different now. Patient states symptoms went away but have flared back up lately. 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 Most recently she returned to work and is frequently in positions where her elbows are bent. Patient states she is having tingling sensation in both elbow areas and pain in her hands. Patient states her hands swell at night. She feels like she has the sensation of having to shake her hands out to make this better. She has been wearing carpal tunnel braces with not much relief. Patient has been taking otc Tylenol arthritis with mild relief.    Allergies  Allergen Reactions  . Morphine And Related Anaphylaxis     Current Outpatient Medications:  .  clonazePAM (KLONOPIN) 0.5 MG tablet, Take 0.5 mg by mouth 2 (two) times daily as needed., Disp: , Rfl: 0 .  cyclobenzaprine (FLEXERIL) 5 MG tablet, Take 1 tablet (5 mg total) by mouth daily as needed for muscle spasms., Disp: 30 tablet, Rfl: 0 .  gabapentin (NEURONTIN) 300 MG capsule, Take 1 capsule (300 mg total) by mouth 2 (two) times daily., Disp: 180 capsule, Rfl: 1 .  venlafaxine (EFFEXOR) 37.5 MG tablet, TAKE 1 TABLET BY MOUTH DAILY, Disp: 90 tablet, Rfl: 1 .  hydrOXYzine (ATARAX/VISTARIL) 10 MG tablet, Take 1 tablet (10 mg total) by mouth daily as needed. (Patient not taking: Reported on 06/12/2019), Disp: 30 tablet, Rfl: 0 .  meloxicam (MOBIC) 7.5 MG tablet, Take 1 tablet (7.5 mg total) by mouth daily. (Patient not taking: Reported on 06/12/2019), Disp: 30 tablet, Rfl: 0  Review of Systems  Constitutional:  Negative for appetite change, chills, fatigue and fever.  Respiratory: Negative for chest tightness and shortness of breath.   Cardiovascular: Negative for chest pain and palpitations.  Gastrointestinal: Negative for abdominal pain, nausea and vomiting.  Neurological: Negative for dizziness and weakness.    Social History   Tobacco Use  . Smoking status: Never Smoker  . Smokeless tobacco: Never Used  Substance Use Topics  . Alcohol use: No      Objective:   BP 113/79 (BP Location: Left Arm, Patient Position: Sitting, Cuff Size: Large)   Pulse 96   Temp 98.6 F (37 C) (Oral)   Resp 16   Ht 5\' 1"  (1.549 m)   Wt 115 lb (52.2 kg)   SpO2 96%   BMI 21.73 kg/m  Vitals:   06/12/19 0949  BP: 113/79  Pulse: 96  Resp: 16  Temp: 98.6 F (37 C)  TempSrc: Oral  SpO2: 96%  Weight: 115 lb (52.2 kg)  Height: 5\' 1"  (1.549 m)     Physical Exam Constitutional:      Appearance: Normal appearance.  Musculoskeletal:     Comments: Full ROM in hands and elbows bilaterally. Phalen's positive bilaterally.   Skin:    General: Skin is warm and dry.  Neurological:     General: No focal deficit present.     Mental Status: She is alert and oriented to person, place,  and time.  Psychiatric:        Mood and Affect: Mood normal.        Behavior: Behavior normal.      No results found for any visits on 06/12/19.     Assessment & Plan    1. Bilateral carpal tunnel syndrome  Use brace at night, meloxicam x 2 weeks. If not effective, will need referral to ortho for steroid injection. She will message back if she'f   2. Cubital tunnel syndrome of both upper extremities   Likely due to the positioning from her job. Counseled ideally she would not be bending her elbows as much but this may not be possible due to her job duties. NSAIDs may also help with this but it may be somewhat of a chronic issue for her.    3. Pain of left upper extremity  - meloxicam (MOBIC) 7.5 MG tablet; Take 1  tablet (7.5 mg total) by mouth daily.  Dispense: 30 tablet; Refill: 0  The entirety of the information documented in the History of Present Illness, Review of Systems and Physical Exam were personally obtained by me. Portions of this information were initially documented by Martyn EhrichLatasha Walston, CMA and reviewed by me for thoroughness and accuracy.   The entirety of the information documented in the History of Present Illness, Review of Systems and Physical Exam were personally obtained by me. Portions of this information were initially documented by April M. Hyacinth MeekerMiller, CMA and reviewed by me for thoroughness and accuracy.   F/u PRN      Trey SailorsAdriana M Alfonzia Woolum, PA-C  [facility]

## 2019-08-16 ENCOUNTER — Encounter: Payer: Self-pay | Admitting: Physician Assistant

## 2019-08-16 DIAGNOSIS — M792 Neuralgia and neuritis, unspecified: Secondary | ICD-10-CM

## 2019-08-17 MED ORDER — PREGABALIN 75 MG PO CAPS: 75.0000 mg | ORAL_CAPSULE | Freq: Two times a day (BID) | ORAL | 2 refills | Status: DC

## 2019-12-22 ENCOUNTER — Other Ambulatory Visit: Payer: Self-pay | Admitting: Obstetrics and Gynecology

## 2019-12-22 DIAGNOSIS — Z1231 Encounter for screening mammogram for malignant neoplasm of breast: Secondary | ICD-10-CM

## 2019-12-24 ENCOUNTER — Other Ambulatory Visit: Payer: Self-pay | Admitting: Physician Assistant

## 2019-12-24 DIAGNOSIS — G43809 Other migraine, not intractable, without status migrainosus: Secondary | ICD-10-CM

## 2019-12-24 NOTE — Telephone Encounter (Signed)
Requested medication (s) are due for refill today:  Yes  Requested medication (s) are on the active medication list:   Yes  Future visit scheduled:   No   Last ordered: 06/08/2019  #90  1 refill   Returned because lab work due and invalid encounter within the last 6 months.   Requested Prescriptions  Pending Prescriptions Disp Refills   venlafaxine (EFFEXOR) 37.5 MG tablet [Pharmacy Med Name: VENLAFAXINE HCL 37.5 MG TAB] 90 tablet 1    Sig: TAKE ONE TABLET BY MOUTH EVERY DAY      Psychiatry: Antidepressants - SNRI - desvenlafaxine & venlafaxine Failed - 12/24/2019 11:42 AM      Failed - LDL in normal range and within 360 days    LDL Cholesterol (Calc)  Date Value Ref Range Status  09/25/2017 63 mg/dL (calc) Final    Comment:    Reference range: <100 . Desirable range <100 mg/dL for primary prevention;   <70 mg/dL for patients with CHD or diabetic patients  with > or = 2 CHD risk factors. Marland Kitchen LDL-C is now calculated using the Martin-Hopkins  calculation, which is a validated novel method providing  better accuracy than the Friedewald equation in the  estimation of LDL-C.  Horald Pollen et al. Lenox Ahr. 0865;784(69): 2061-2068  (http://education.[facility].com/faq/FAQ164)           Failed - Total Cholesterol in normal range and within 360 days    Cholesterol  Date Value Ref Range Status  09/25/2017 134 <200 mg/dL Final          Failed - Triglycerides in normal range and within 360 days    Triglycerides  Date Value Ref Range Status  09/25/2017 49 <150 mg/dL Final          Failed - Valid encounter within last 6 months    Recent Outpatient Visits           6 months ago Bilateral carpal tunnel syndrome   [facility] Osvaldo Angst M, [place]   10 months ago Pain of left upper extremity   [facility] Trey Sailors, [place]   1 year ago Neuropathy   [facility] [place], Ricki Rodriguez M, [place]   1 year ago Herpes zoster without  complication   [facility] Osvaldo Angst M, [place]   1 year ago Anxiety   [facility] Osvaldo Angst M, [place]              Passed - Last BP in normal range    BP Readings from Last 1 Encounters:  06/12/19 113/79

## 2020-02-09 ENCOUNTER — Ambulatory Visit
Admission: RE | Admit: 2020-02-09 | Discharge: 2020-02-09 | Disposition: A | Discharge status: Home / Self Care | Payer: 59 | Source: Ambulatory Visit | Attending: Obstetrics and Gynecology | Admitting: Obstetrics and Gynecology

## 2020-02-09 DIAGNOSIS — Z1231 Encounter for screening mammogram for malignant neoplasm of breast: Secondary | ICD-10-CM | POA: Insufficient documentation

## 2020-07-30 ENCOUNTER — Other Ambulatory Visit: Payer: Self-pay | Admitting: Physician Assistant

## 2020-07-30 DIAGNOSIS — G43809 Other migraine, not intractable, without status migrainosus: Secondary | ICD-10-CM

## 2020-07-31 NOTE — Telephone Encounter (Signed)
Requested medication (s) are due for refill today: yes  Requested medication (s) are on the active medication list: yes  Last refill:  12/24/19  Future visit scheduled: no  Notes to clinic:  called pt to make appt. LM on Vm to call office to schedule appt.   Requested Prescriptions  Pending Prescriptions Disp Refills   venlafaxine (EFFEXOR) 37.5 MG tablet [Pharmacy Med Name: VENLAFAXINE HCL 37.5 MG TAB] 90 tablet 1    Sig: TAKE ONE TABLET BY MOUTH EVERY DAY      Psychiatry: Antidepressants - SNRI - desvenlafaxine & venlafaxine Failed - 07/30/2020 10:14 AM      Failed - LDL in normal range and within 360 days    LDL Cholesterol (Calc)  Date Value Ref Range Status  09/25/2017 63 mg/dL (calc) Final    Comment:    Reference range: <100 . Desirable range <100 mg/dL for primary prevention;   <70 mg/dL for patients with CHD or diabetic patients  with > or = 2 CHD risk factors. Marland Kitchen LDL-C is now calculated using the Martin-Hopkins  calculation, which is a validated novel method providing  better accuracy than the Friedewald equation in the  estimation of LDL-C.  Horald Pollen et al. Lenox Ahr. 5974;163(84): 2061-2068  (http://education.[facility].com/faq/FAQ164)           Failed - Total Cholesterol in normal range and within 360 days    Cholesterol  Date Value Ref Range Status  09/25/2017 134 <200 mg/dL Final          Failed - Triglycerides in normal range and within 360 days    Triglycerides  Date Value Ref Range Status  09/25/2017 49 <150 mg/dL Final          Failed - Valid encounter within last 6 months    Recent Outpatient Visits           1 year ago Bilateral carpal tunnel syndrome   [facility] Trey Sailors, [place]   1 year ago Pain of left upper extremity   [facility] Trey Sailors, [place]   1 year ago Neuropathy   [facility] [place], Ricki Rodriguez M, [place]   1 year ago Herpes zoster without complication   [facility] Osvaldo Angst M, [place]   2 years ago Anxiety   [facility] Osvaldo Angst M, [place]              Passed - Last BP in normal range    BP Readings from Last 1 Encounters:  06/12/19 113/79

## 2020-08-17 ENCOUNTER — Other Ambulatory Visit: Payer: Self-pay

## 2020-08-17 ENCOUNTER — Ambulatory Visit (INDEPENDENT_AMBULATORY_CARE_PROVIDER_SITE_OTHER): Payer: 59 | Admitting: Physician Assistant

## 2020-08-17 ENCOUNTER — Encounter: Payer: Self-pay | Admitting: Physician Assistant

## 2020-08-17 VITALS — BP 114/64 | HR 86 | Temp 98.8°F | Resp 16 | Ht 60.0 in | Wt 109.0 lb

## 2020-08-17 DIAGNOSIS — G43809 Other migraine, not intractable, without status migrainosus: Secondary | ICD-10-CM

## 2020-08-17 DIAGNOSIS — F419 Anxiety disorder, unspecified: Secondary | ICD-10-CM

## 2020-08-17 MED ORDER — VENLAFAXINE HCL 37.5 MG PO TABS: 37.5000 mg | ORAL_TABLET | Freq: Every day | ORAL | 3 refills | Status: DC

## 2020-08-17 NOTE — Progress Notes (Signed)
Established patient visit   Patient: Alyssa Reeves   DOB: 17-Apr-1981   39 y.o. Female  MRN: [id number] Visit Date: 08/17/2020  Today's healthcare provider: Trey Sailors, PA-C   Chief Complaint  Patient presents with  . Anxiety   Subjective    HPI  Anxiety, Follow-up  She was last seen for anxiety 1 years ago. Changes made at last visit include adding hydroxyzine. 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 Is planning to help at her daughter's school in the afternoon.     She reports good compliance with treatment. She reports good tolerance of treatment. She is not having side effects.   She feels her anxiety is mild and Unchanged since last visit.  Symptoms: No chest pain No difficulty concentrating  No dizziness No fatigue  No feelings of losing control No insomnia  No irritable No palpitations  No panic attacks No racing thoughts  No shortness of breath No sweating  No tremors/shakes    GAD-7 Results GAD-7 Generalized Anxiety Disorder Screening Tool 08/17/2020 11/14/2018  1. Feeling Nervous, Anxious, or on Edge 3 1  2. Not Being Able to Stop or Control Worrying 1 1  3. Worrying Too Much About Different Things 2 0  4. Trouble Relaxing 1 1  5. Being So Restless it's Hard To Sit Still 1 1  6. Becoming Easily Annoyed or Irritable 1 1  7. Feeling Afraid As If Something Awful Might Happen 0 0  Total GAD-7 Score 9 5  Difficulty At Work, Home, or Getting  Along With Others? Not difficult at all Not difficult at all    PHQ-9 Scores PHQ9 SCORE ONLY 06/12/2019 09/06/2017  PHQ-9 Total Score 0 1   Treating her cervical radiculopathy with diclofenac and massage therapy.     Medications: Outpatient Medications Prior to Visit  Medication Sig  . clonazePAM (KLONOPIN) 0.5 MG tablet Take 0.5 mg by mouth 2 (two) times daily as needed.  . cyclobenzaprine (FLEXERIL) 5 MG tablet Take 1 tablet (5 mg total) by mouth daily as needed for muscle spasms.  .  diclofenac (VOLTAREN) 50 MG EC tablet Take 1 tablet by mouth 2 (two) times daily as needed.  . venlafaxine (EFFEXOR) 37.5 MG tablet Take 1 tablet (37.5 mg total) by mouth daily. Please schedule an office visit before anymore refills.  . gabapentin (NEURONTIN) 300 MG capsule Take 1 capsule (300 mg total) by mouth 2 (two) times daily.  . hydrOXYzine (ATARAX/VISTARIL) 10 MG tablet Take 1 tablet (10 mg total) by mouth daily as needed. (Patient not taking: Reported on 06/12/2019)  . meloxicam (MOBIC) 7.5 MG tablet Take 1 tablet (7.5 mg total) by mouth daily. (Patient not taking: Reported on 08/17/2020)  . pregabalin (LYRICA) 75 MG capsule Take 1 capsule (75 mg total) by mouth 2 (two) times daily.   No facility-administered medications prior to visit.    Review of Systems  Constitutional: Negative.   Respiratory: Negative.   Cardiovascular: Negative.   Neurological: Negative.   Psychiatric/Behavioral: Negative.       Objective    BP 114/64   Pulse 86   Temp 98.8 F (37.1 C)   Resp 16   Ht 5' (1.524 m)   Wt 109 lb (49.4 kg)   BMI 21.29 kg/m    Physical Exam Constitutional:      Appearance: Normal appearance.  Cardiovascular:     Rate and Rhythm: Normal rate and regular rhythm.  Heart sounds: Normal heart sounds.  Pulmonary:     Effort: Pulmonary effort is normal.     Breath sounds: Normal breath sounds.  Skin:    General: Skin is warm and dry.  Neurological:     Mental Status: She is alert and oriented to person, place, and time. Mental status is at baseline.  Psychiatric:        Mood and Affect: Mood normal.        Behavior: Behavior normal.       No results found for any visits on 08/17/20.  Assessment & Plan    1. Anxiety  Continue effexor. She has not needed hydroxyzine.   2. Other migraine without status migrainosus, not intractable  - venlafaxine (EFFEXOR) 37.5 MG tablet; Take 1 tablet (37.5 mg total) by mouth daily. Please schedule an office visit before  anymore refills.  Dispense: 90 tablet; Refill: 3    No follow-ups on file.      ITrey Sailors, PA-C, have reviewed all documentation for this visit. The documentation on 08/17/20 for the exam, diagnosis, procedures, and orders are all accurate and complete.  The entirety of the information documented in the History of Present Illness, Review of Systems and Physical Exam were personally obtained by me. Portions of this information were initially documented by [facility] and reviewed by me for thoroughness and accuracy.     Alyssa Reeves  [facility] [phone] (phone) [phone] (fax)  [facility]

## 2020-08-17 NOTE — Patient Instructions (Signed)
General Headache Without Cause A headache is pain or discomfort that is felt around the head or neck area. There are many causes and types of headaches. In some cases, the cause may not be found. Follow these instructions at home: Watch your condition for any changes. Let your doctor know about them. Take these steps to help with your condition: Managing pain      Take over-the-counter and prescription medicines only as told by your doctor.  Lie down in a dark, quiet room when you have a headache.  If told, put ice on your head and neck area: ? Put ice in a plastic bag. ? Place a towel between your skin and the bag. ? Leave the ice on for 20 minutes, 2-3 times per day.  If told, put heat on the affected area. Use the heat source that your doctor recommends, such as a moist heat pack or a heating pad. ? Place a towel between your skin and the heat source. ? Leave the heat on for 20-30 minutes. ? Remove the heat if your skin turns bright red. This is very important if you are unable to feel pain, heat, or cold. You may have a greater risk of getting burned.  Keep lights dim if bright lights bother you or make your headaches worse. Eating and drinking  Eat meals on a regular schedule.  If you drink alcohol: ? Limit how much you use to:  0-1 drink a day for women.  0-2 drinks a day for men. ? Be aware of how much alcohol is in your drink. In the [place] one drink equals one 12 oz bottle of beer (355 mL), one 5 oz glass of wine (148 mL), or one 1 oz glass of hard liquor (44 mL).  Stop drinking caffeine, or reduce how much caffeine you drink. General instructions   Keep a journal to find out if certain things bring on headaches. For example, write down: ? What you eat and drink. ? How much sleep you get. ? Any change to your diet or medicines.  Get a massage or try other ways to relax.  Limit stress.  Sit up straight. Do not tighten (tense) your muscles.  Do not use any  products that contain nicotine or tobacco. This includes cigarettes, e-cigarettes, and chewing tobacco. If you need help quitting, ask your doctor.  Exercise regularly as told by your doctor.  Get enough sleep. This often means 7-9 hours of sleep each night.  Keep all follow-up visits as told by your doctor. This is important. Contact a doctor if:  Your symptoms are not helped by medicine.  You have a headache that feels different than the other headaches.  You feel sick to your stomach (nauseous) or you throw up (vomit).  You have a fever. Get help right away if:  Your headache gets very bad quickly.  Your headache gets worse after a lot of physical activity.  You keep throwing up.  You have a stiff neck.  You have trouble seeing.  You have trouble speaking.  You have pain in the eye or ear.  Your muscles are weak or you lose muscle control.  You lose your balance or have trouble walking.  You feel like you will pass out (faint) or you pass out.  You are mixed up (confused).  You have a seizure. Summary  A headache is pain or discomfort that is felt around the head or neck area.  There are many causes and   types of headaches. In some cases, the cause may not be found.  Keep a journal to help find out what causes your headaches. Watch your condition for any changes. Let your doctor know about them.  Contact a doctor if you have a headache that is different from usual, or if your headache is not helped by medicine.  Get help right away if your headache gets very bad, you throw up, you have trouble seeing, you lose your balance, or you have a seizure. This information is not intended to replace advice given to you by your health care provider. Make sure you discuss any questions you have with your health care provider. Document Revised: 05/12/2018 Document Reviewed: 05/12/2018 Elsevier Patient Education  2020 Elsevier Inc.  

## 2020-08-18 ENCOUNTER — Telehealth: Payer: Self-pay | Admitting: Physician Assistant

## 2020-08-18 NOTE — Telephone Encounter (Signed)
Can we abstract PAP smear from [facility] 2020? Thanks.

## 2020-08-18 NOTE — Telephone Encounter (Signed)
Done

## 2021-08-18 ENCOUNTER — Ambulatory Visit: Payer: Self-pay | Admitting: Physician Assistant

## 2021-11-01 ENCOUNTER — Telehealth: Payer: Self-pay | Admitting: Physician Assistant

## 2021-11-01 ENCOUNTER — Other Ambulatory Visit: Payer: Self-pay

## 2021-11-01 DIAGNOSIS — G43809 Other migraine, not intractable, without status migrainosus: Secondary | ICD-10-CM

## 2021-11-01 NOTE — Telephone Encounter (Signed)
[facility] faxed refill request for the following medications:  venlafaxine (EFFEXOR) 37.5 MG tablet   Please advise.

## 2021-11-13 ENCOUNTER — Other Ambulatory Visit: Payer: Self-pay | Admitting: Obstetrics and Gynecology

## 2021-11-13 DIAGNOSIS — Z1231 Encounter for screening mammogram for malignant neoplasm of breast: Secondary | ICD-10-CM

## 2021-11-22 ENCOUNTER — Ambulatory Visit: Payer: Self-pay
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
## 2021-12-25 ENCOUNTER — Ambulatory Visit
Admission: RE | Admit: 2021-12-25 | Discharge: 2021-12-25 | Disposition: A | Discharge status: Home / Self Care | Payer: 59 | Source: Ambulatory Visit | Attending: Obstetrics and Gynecology | Admitting: Obstetrics and Gynecology

## 2021-12-25 ENCOUNTER — Other Ambulatory Visit: Payer: Self-pay

## 2021-12-25 DIAGNOSIS — Z1231 Encounter for screening mammogram for malignant neoplasm of breast: Secondary | ICD-10-CM | POA: Diagnosis not present

## 2022-01-08 ENCOUNTER — Encounter: Payer: Self-pay | Admitting: Family Medicine

## 2022-01-08 ENCOUNTER — Ambulatory Visit (INDEPENDENT_AMBULATORY_CARE_PROVIDER_SITE_OTHER): Payer: 59 | Admitting: Family Medicine

## 2022-01-08 ENCOUNTER — Other Ambulatory Visit: Payer: Self-pay

## 2022-01-08 VITALS — BP 141/87 | HR 92 | Temp 98.5°F | Wt 114.0 lb

## 2022-01-08 DIAGNOSIS — Z0000 Encounter for general adult medical examination without abnormal findings: Secondary | ICD-10-CM | POA: Insufficient documentation

## 2022-01-08 DIAGNOSIS — Z79899 Other long term (current) drug therapy: Secondary | ICD-10-CM | POA: Insufficient documentation

## 2022-01-08 DIAGNOSIS — F410 Panic disorder [episodic paroxysmal anxiety] without agoraphobia: Secondary | ICD-10-CM | POA: Diagnosis not present

## 2022-01-08 DIAGNOSIS — R52 Pain, unspecified: Secondary | ICD-10-CM | POA: Insufficient documentation

## 2022-01-08 DIAGNOSIS — I10 Essential (primary) hypertension: Secondary | ICD-10-CM | POA: Diagnosis not present

## 2022-01-08 DIAGNOSIS — F411 Generalized anxiety disorder: Secondary | ICD-10-CM | POA: Insufficient documentation

## 2022-01-08 DIAGNOSIS — R69 Illness, unspecified: Secondary | ICD-10-CM | POA: Diagnosis not present

## 2022-01-08 MED ORDER — CLONAZEPAM 0.5 MG PO TABS: 0.5000 mg | ORAL_TABLET | Freq: Two times a day (BID) | ORAL | 1 refills | Status: DC | PRN

## 2022-01-08 MED ORDER — VENLAFAXINE HCL 37.5 MG PO TABS: 37.5000 mg | ORAL_TABLET | Freq: Every day | ORAL | 3 refills | Status: AC

## 2022-01-08 NOTE — Assessment & Plan Note (Signed)
PRN use ?Will establish contract ?Will get UDS ?

## 2022-01-08 NOTE — Assessment & Plan Note (Signed)
Hx of panic attacks, managed with PRN benzo ?Established controlled substance contract ?Patient is on controller agent ?Use is appropriate ?Will get UDS today ?

## 2022-01-08 NOTE — Assessment & Plan Note (Signed)
Encouraged dental and vision cleaning and screening appts ?Things to do to keep yourself healthy  ?- Exercise at least 30-45 minutes a day, 3-4 days a week.  ?- Eat a low-fat diet with lots of fruits and vegetables, up to 7-9 servings per day.  ?- Seatbelts can save your life. Wear them always.  ?- Smoke detectors on every level of your home, check batteries every year.  ?- Eye Doctor - have an eye exam every 1-2 years  ?- Safe sex - if you may be exposed to STDs, use a condom.  ?- Alcohol -  If you drink, do it moderately, less than 2 drinks per day.  ?- Health Care Power of [address]. Choose someone to speak for you if you are not able.  ?- Depression is common in our stressful world.If you're feeling down or losing interest in things you normally enjoy, please come in for a visit.  ?- Violence - If anyone is threatening or hurting you, please call immediately. ? ?Has OB who she sees for PAP and mammo orders ?

## 2022-01-08 NOTE — Assessment & Plan Note (Signed)
Chronic, stable ?Continue effexor ?Hx of panic attacks- use of PRN agent in addition, use is not daily 3 Rx found on review of PDMP ?Encourage additional agents, referral to psych if symptoms/circumstances change ?

## 2022-01-08 NOTE — Assessment & Plan Note (Signed)
Elevation seen at CPE ?Encouraged use of home BP cuff to monitor ?Recommend 1 month f/u ?Hx of HTN in pregnancy; not on agents ?No increase in weight noted ?Encouraged DASH diet ?

## 2022-01-08 NOTE — Progress Notes (Signed)
Alyssa Reeves,acting as a scribe for Jacky Kindle, FNP.,have documented all relevant documentation on the behalf of Jacky Kindle, FNP,as directed by  Jacky Kindle, FNP while in the presence of Jacky Kindle, FNP.    Complete physical exam   Patient: [name]   DOB: [birth year]   41 y.o. Female  MRN: [id number] Visit Date: 01/08/2022  Today's healthcare provider: Jacky Kindle, FNP  PT presents for new patient visit to establish care.  Introduced to Publishing rights manager role and practice setting.  All questions answered.  Discussed provider/patient relationship and expectations.   No chief complaint on file.  Subjective    Alyssa Reeves is a 41 y.o. female who presents today for a complete physical exam.  She reports consuming a general diet. The patient does not participate in regular exercise at present. She generally feels well. She reports sleeping well. She does have additional problems to discuss today.  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   CESAREAN SECTION     CESAREAN SECTION WITH BILATERAL TUBAL LIGATION N/A 07/01/2016   Procedure: cesarean section;  Surgeon: Suzy Bouchard, MD;  Location: [facility] ORS;  Service: Obstetrics;  Laterality: N/A;   CHOLECYSTECTOMY  1996   Social History   Socioeconomic History   Marital status: Married    Spouse name: Not on file   Number of children: Not on file   Years of education: Not on file   Highest education level: Not on file  Occupational History   Not on file  Tobacco Use   Smoking status: Never   Smokeless tobacco: Never  Vaping Use   Vaping Use: Never used  Substance and Sexual Activity   Alcohol use: No   Drug use: No   Sexual activity: Yes   Other Topics Concern   Not on file  Social History Narrative   Not on file   Social Determinants of Health   Financial Resource Strain: Not on file  Food Insecurity: Not on file  Transportation Needs: Not on file  Physical Activity: Not on file  Stress: Not on file  Social Connections: Not on file  Intimate Partner Violence: Not on file   Family Status  Relation Name Status   Mother  Alive   Father  Alive   MGM  Alive   PGM  Deceased   Neg Hx  (Not Specified)   Family History  Problem Relation Age of Onset   Diabetes Mother    Hypertension Father    Diabetes Maternal Grandmother    Diabetes Paternal Grandmother    Breast cancer Neg Hx    Allergies  Allergen Reactions   Morphine And Related Anaphylaxis    Patient Care Team: Jacky Kindle, FNP as PCP - General (Family Medicine)   Medications: Outpatient Medications Prior to Visit  Medication Sig   [DISCONTINUED] clonazePAM (KLONOPIN) 0.5 MG tablet Take 0.5 mg by mouth 2 (two) times daily as needed.   [DISCONTINUED] venlafaxine (EFFEXOR) 37.5 MG tablet Take 1 tablet (37.5 mg total) by mouth daily. Please schedule an office visit before anymore refills.   [DISCONTINUED] cyclobenzaprine (FLEXERIL) 5 MG tablet Take 1 tablet (5 mg  total) by mouth daily as needed for muscle spasms.   [DISCONTINUED] diclofenac (VOLTAREN) 50 MG EC tablet Take 1 tablet by mouth 2 (two) times daily as needed.   [DISCONTINUED] pregabalin (LYRICA) 75 MG capsule Take 1 capsule (75 mg total) by mouth 2 (two) times daily.   No facility-administered medications prior to visit.    Review of Systems  Constitutional: Negative.   HENT: Negative.    Eyes: Negative.   Respiratory: Negative.    Cardiovascular: Negative.   Gastrointestinal: Negative.   Endocrine: Negative.   Genitourinary: Negative.   Musculoskeletal: Negative.   Skin: Negative.   Allergic/Immunologic: Negative.   Neurological: Negative.   Hematological: Negative.    Psychiatric/Behavioral: Negative.        Objective    BP (!) 141/87 (BP Location: Right Arm, Patient Position: Sitting, Cuff Size: Normal)    Pulse 92    Temp 98.5 F (36.9 C) (Oral)    Wt 114 lb (51.7 kg)    SpO2 100%    BMI 22.26 kg/m      Physical Exam    Last depression screening scores PHQ 2/9 Scores 01/08/2022 08/17/2020 06/12/2019  PHQ - 2 Score 1 0 0  PHQ- 9 Score 2 - 0   Last fall risk screening Fall Risk  01/08/2022  Falls in the past year? 0  Number falls in past yr: -  Injury with Fall? -  Risk for fall due to : -  Follow up -   Last Audit-C alcohol use screening Alcohol Use Disorder Test (AUDIT) 01/08/2022  1. How often do you have a drink containing alcohol? 0  2. How many drinks containing alcohol do you have on a typical day when you are drinking? -  3. How often do you have six or more drinks on one occasion? -  AUDIT-C Score -  Alcohol Brief Interventions/Follow-up -   A score of 3 or more in women, and 4 or more in men indicates increased risk for alcohol abuse, EXCEPT if all of the points are from question 1   No results found for any visits on 01/08/22.  Assessment & Plan    Routine Health Maintenance and Physical Exam  Exercise Activities and Dietary recommendations  Goals   None     Immunization History  Administered Date(s) Administered   Influenza,inj,Quad PF,6+ Mos 09/06/2017   Influenza-Unspecified 08/13/2018   Tdap 04/25/2016    Health Maintenance  Topic Date Due   COVID-19 Vaccine (1) Never done   HIV Screening  Never done   Hepatitis C Screening  Never done   PAP SMEAR-Modifier  11/28/2021   TETANUS/TDAP  04/25/2026   HPV VACCINES  Aged Out   INFLUENZA VACCINE  Discontinued    Discussed health benefits of physical activity, and encouraged her to engage in regular exercise appropriate for her age and condition.  Problem List Items Addressed This Visit       Cardiovascular and Mediastinum   Elevated blood pressure reading  in office with diagnosis of hypertension    Elevation seen at CPE Encouraged use of home BP cuff to monitor Recommend 1 month f/u Hx of HTN in pregnancy; not on agents No increase in weight noted Encouraged DASH diet        Other   Annual physical exam - Primary    Encouraged dental and vision cleaning and screening appts Things to do to keep yourself healthy  - Exercise at least 30-45 minutes a day, 3-4 days a week.  -  Eat a low-fat diet with lots of fruits and vegetables, up to 7-9 servings per day.  - Seatbelts can save your life. Wear them always.  - Smoke detectors on every level of your home, check batteries every year.  - Eye Doctor - have an eye exam every 1-2 years  - Safe sex - if you may be exposed to STDs, use a condom.  - Alcohol -  If you drink, do it moderately, less than 2 drinks per day.  - Health Care Power of Attorney. Choose someone to speak for you if you are not able.  - Depression is common in our stressful world.If you're feeling down or losing interest in things you normally enjoy, please come in for a visit.  - Violence - If anyone is threatening or hurting you, please call immediately.  Has OB who she sees for PAP and mammo orders      Chronic prescription benzodiazepine use    PRN use Will establish contract Will get UDS      Relevant Orders   Drug Screen 12+Alcohol+CRT, Ur   GAD (generalized anxiety disorder)    Chronic, stable Continue effexor Hx of panic attacks- use of PRN agent in addition, use is not daily 3 Rx found on review of PDMP Encourage additional agents, referral to psych if symptoms/circumstances change      Relevant Medications   venlafaxine (EFFEXOR) 37.5 MG tablet   clonazePAM (KLONOPIN) 0.5 MG tablet   Other Relevant Orders   Drug Screen 12+Alcohol+CRT, Ur   Panic attacks    Hx of panic attacks, managed with PRN benzo Established controlled substance contract Patient is on controller agent Use is appropriate Will get  UDS today      Relevant Medications   venlafaxine (EFFEXOR) 37.5 MG tablet   Other Relevant Orders   Drug Screen 12+Alcohol+CRT, Ur     Return in about 4 weeks (around 02/05/2022) for blood pressure check.    Leilani Merl, FNP, have reviewed all documentation for this visit. The documentation on 01/08/22 for the exam, diagnosis, procedures, and orders are all accurate and complete.  Jacky Kindle, FNP  [facility] [phone] (phone) [phone] (fax)  [facility]

## 2022-01-10 LAB — DRUG SCREEN 12+ALCOHOL+CRT, UR
Amphetamines, Urine: NEGATIVE ng/mL
BENZODIAZ UR QL: NEGATIVE ng/mL
Barbiturate: NEGATIVE ng/mL
Cannabinoids: NEGATIVE ng/mL
Cocaine (Metabolite): NEGATIVE ng/mL
Creatinine, Urine: 41 mg/dL (ref 20.0–300.0)
Ethanol, Urine: NEGATIVE %
Meperidine: NEGATIVE ng/mL
Methadone: NEGATIVE ng/mL
OPIATE SCREEN URINE: NEGATIVE ng/mL
Oxycodone/Oxymorphone, Urine: NEGATIVE ng/mL
Phencyclidine: NEGATIVE ng/mL
Propoxyphene: NEGATIVE ng/mL
Tramadol: NEGATIVE ng/mL

## 2022-05-15 ENCOUNTER — Other Ambulatory Visit: Payer: Self-pay | Admitting: Family Medicine

## 2022-05-15 DIAGNOSIS — F411 Generalized anxiety disorder: Secondary | ICD-10-CM

## 2022-07-11 ENCOUNTER — Ambulatory Visit: Payer: 59 | Admitting: Family Medicine

## 2022-12-24 DIAGNOSIS — E559 Vitamin D deficiency, unspecified: Secondary | ICD-10-CM | POA: Diagnosis not present

## 2022-12-24 DIAGNOSIS — Z13228 Encounter for screening for other metabolic disorders: Secondary | ICD-10-CM | POA: Diagnosis not present

## 2022-12-24 DIAGNOSIS — Z130 Encounter for screening for diseases of the blood and blood-forming organs and certain disorders involving the immune mechanism: Secondary | ICD-10-CM | POA: Diagnosis not present

## 2022-12-24 DIAGNOSIS — Z13220 Encounter for screening for lipoid disorders: Secondary | ICD-10-CM | POA: Diagnosis not present

## 2022-12-24 DIAGNOSIS — R69 Illness, unspecified: Secondary | ICD-10-CM | POA: Diagnosis not present

## 2022-12-24 DIAGNOSIS — N915 Oligomenorrhea, unspecified: Secondary | ICD-10-CM | POA: Diagnosis not present

## 2022-12-24 DIAGNOSIS — Z1389 Encounter for screening for other disorder: Secondary | ICD-10-CM | POA: Diagnosis not present

## 2022-12-24 DIAGNOSIS — Z01419 Encounter for gynecological examination (general) (routine) without abnormal findings: Secondary | ICD-10-CM | POA: Diagnosis not present

## 2023-01-01 ENCOUNTER — Other Ambulatory Visit: Payer: Self-pay | Admitting: Obstetrics and Gynecology

## 2023-01-01 DIAGNOSIS — Z1231 Encounter for screening mammogram for malignant neoplasm of breast: Secondary | ICD-10-CM

## 2023-02-04 ENCOUNTER — Ambulatory Visit
Admission: RE | Admit: 2023-02-04 | Discharge: 2023-02-04 | Disposition: A | Discharge status: Home / Self Care | Payer: 59 | Source: Ambulatory Visit | Attending: Obstetrics and Gynecology | Admitting: Obstetrics and Gynecology

## 2023-02-04 DIAGNOSIS — Z1231 Encounter for screening mammogram for malignant neoplasm of breast: Secondary | ICD-10-CM | POA: Insufficient documentation

## 2023-11-13 IMAGING — MG MM DIGITAL SCREENING BILAT W/ TOMO AND CAD
8 series · 9 of 24 positions shown · non-contrast
Comparison: Previous exam(s).

CLINICAL DATA: Screening.

EXAM:
DIGITAL SCREENING BILATERAL MAMMOGRAM WITH TOMOSYNTHESIS AND CAD
TECHNIQUE: Bilateral screening digital craniocaudal and mediolateral oblique
mammograms were obtained. Bilateral screening digital breast
tomosynthesis was performed. The images were evaluated with
computer-aided detection.

[R CC synth-2D]
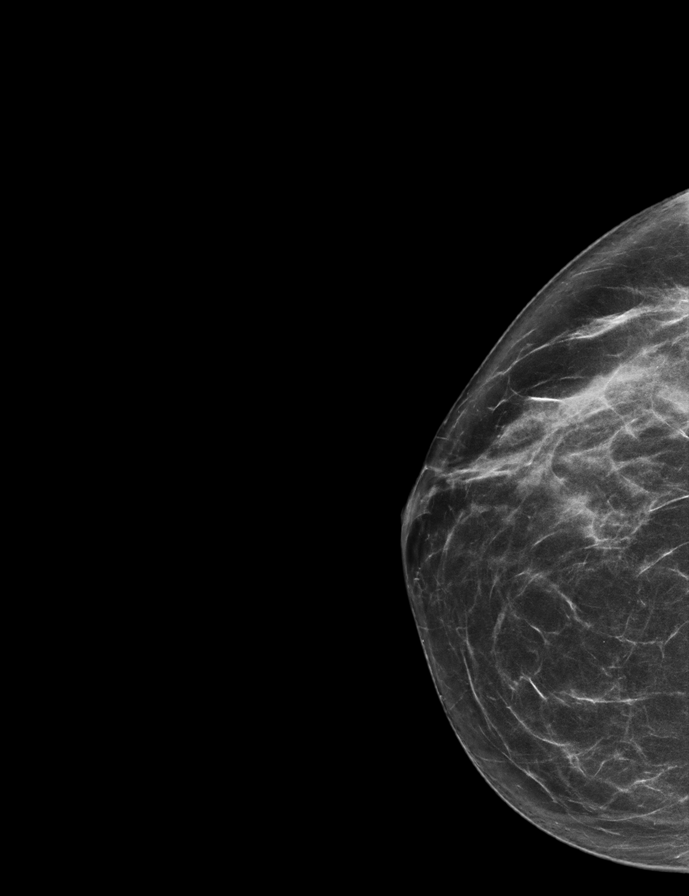

[R MLO synth-2D]
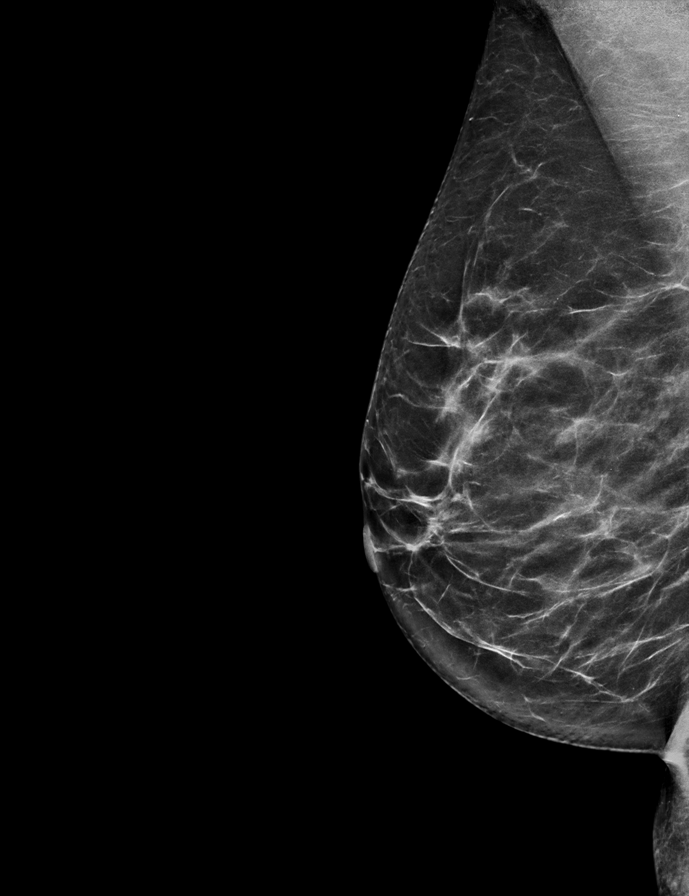

[L MLO synth-2D]
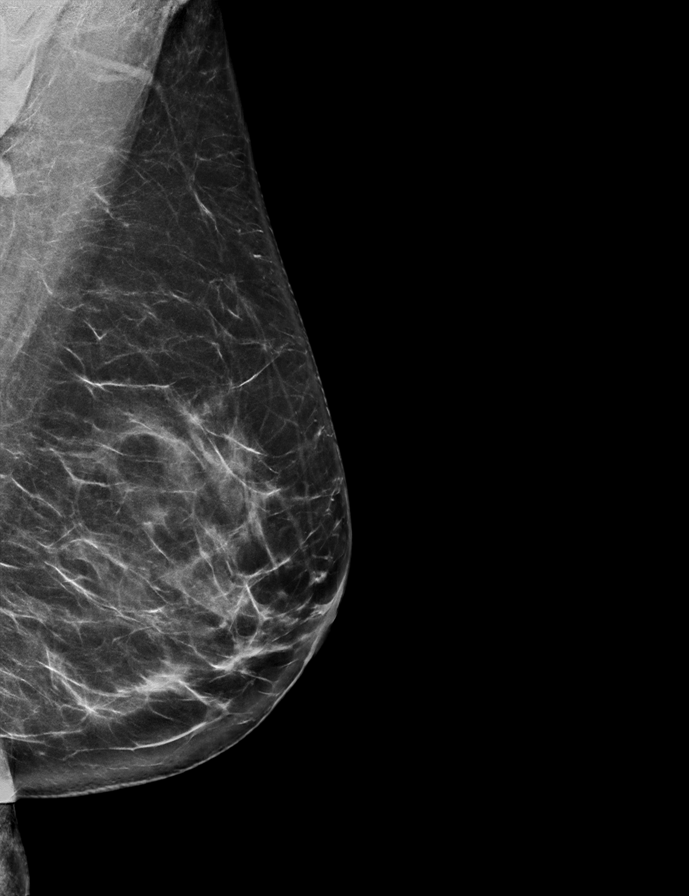

[L CC synth-2D]
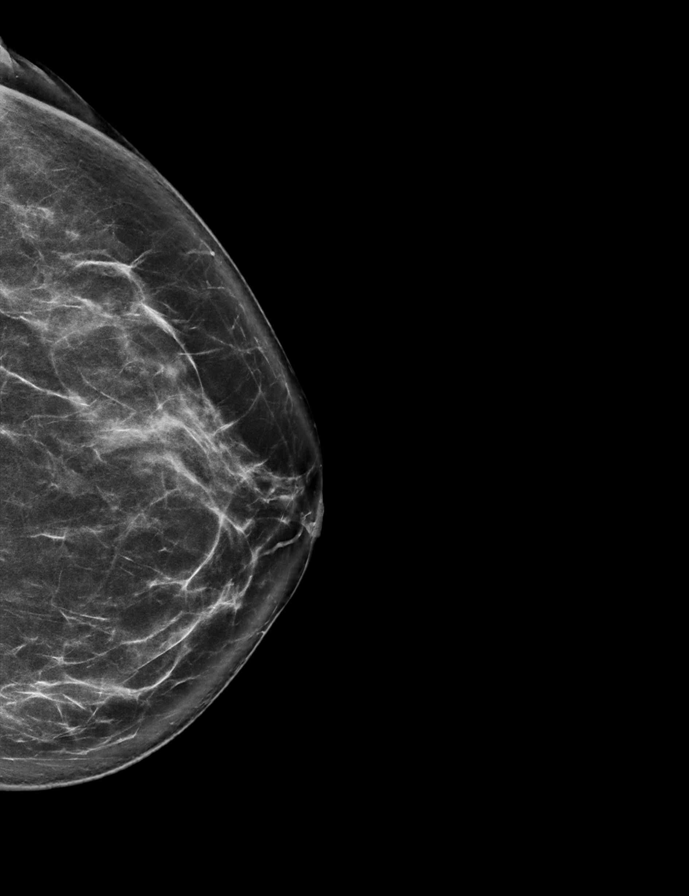

[L CC tomo · 2 of 75 frames shown]
[frame 25/75]
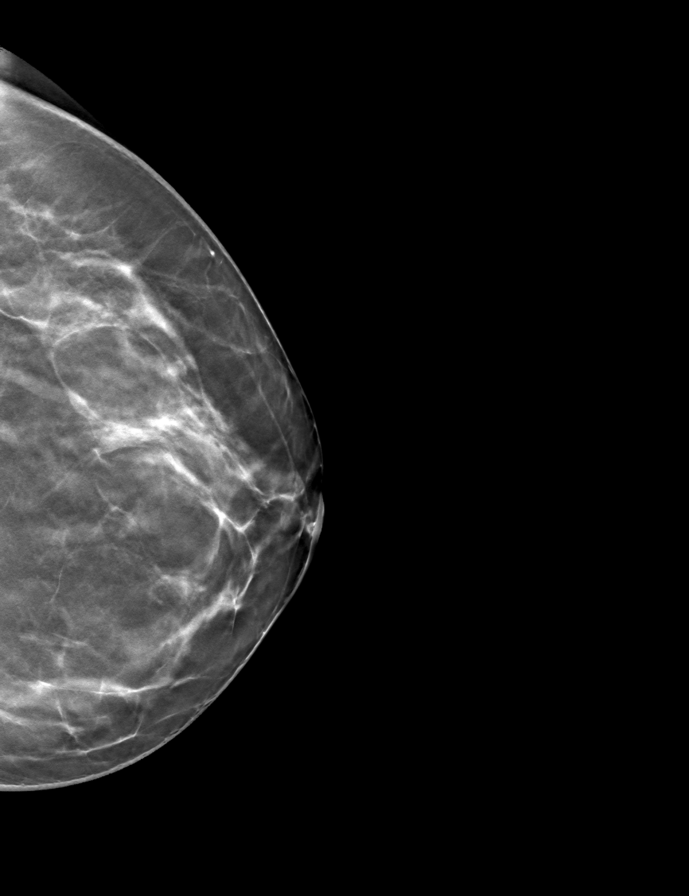
[frame 38/75]
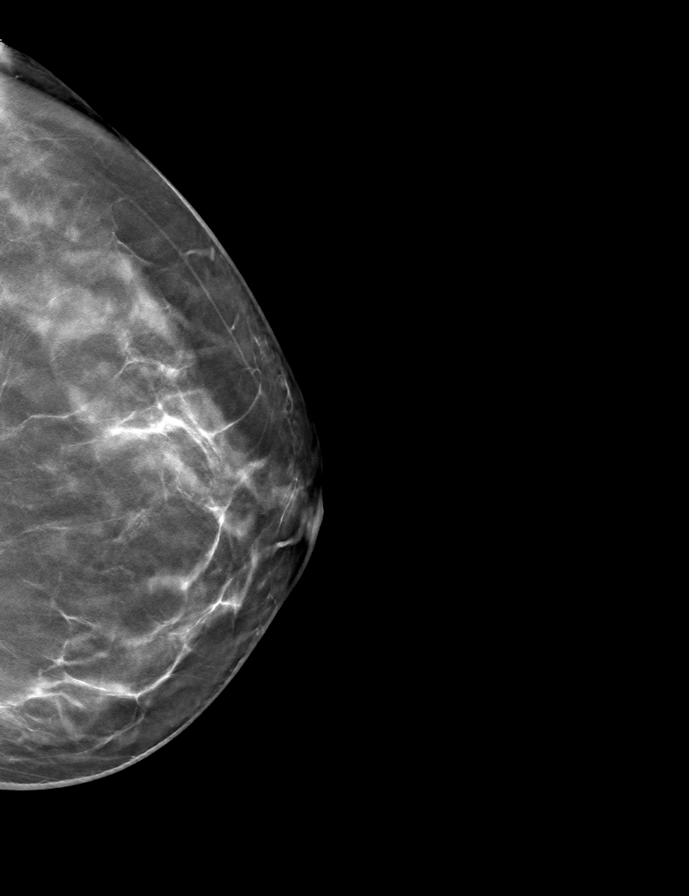

[L MLO tomo · tomo slice 37/74.0]
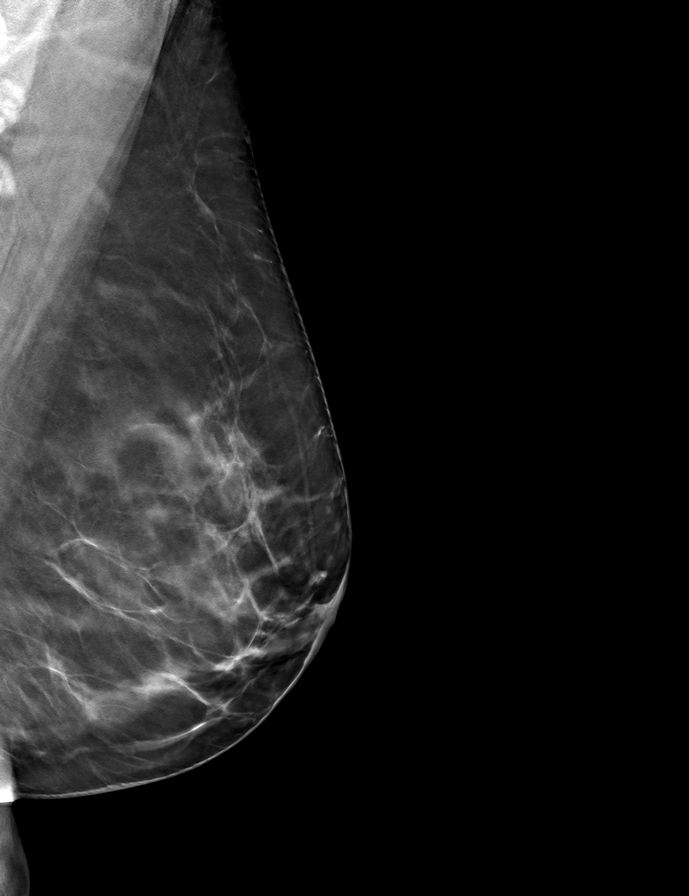

[R CC tomo · tomo slice 32/63.0]
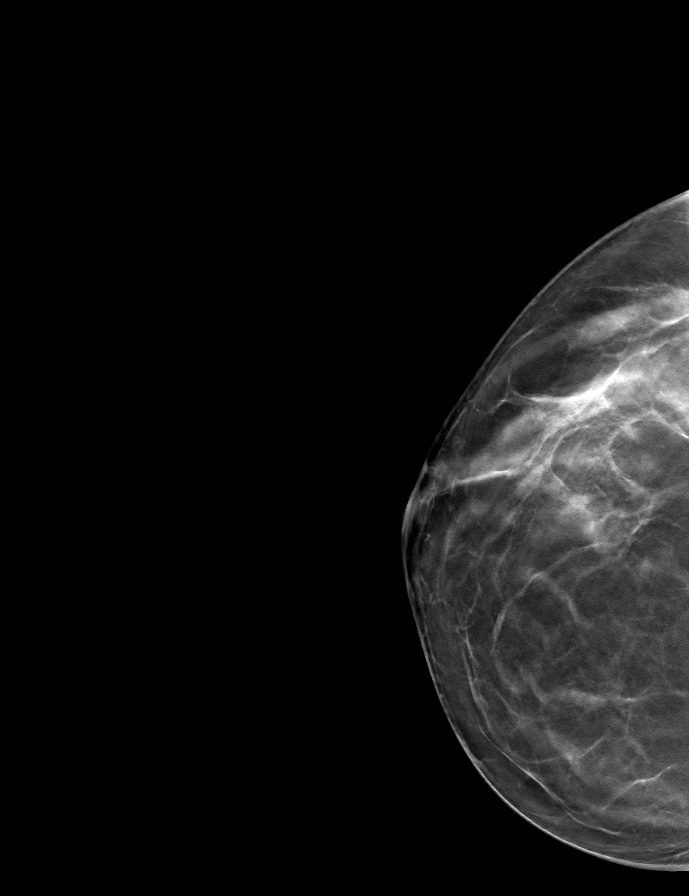

[R MLO tomo · tomo slice 33/66.0]
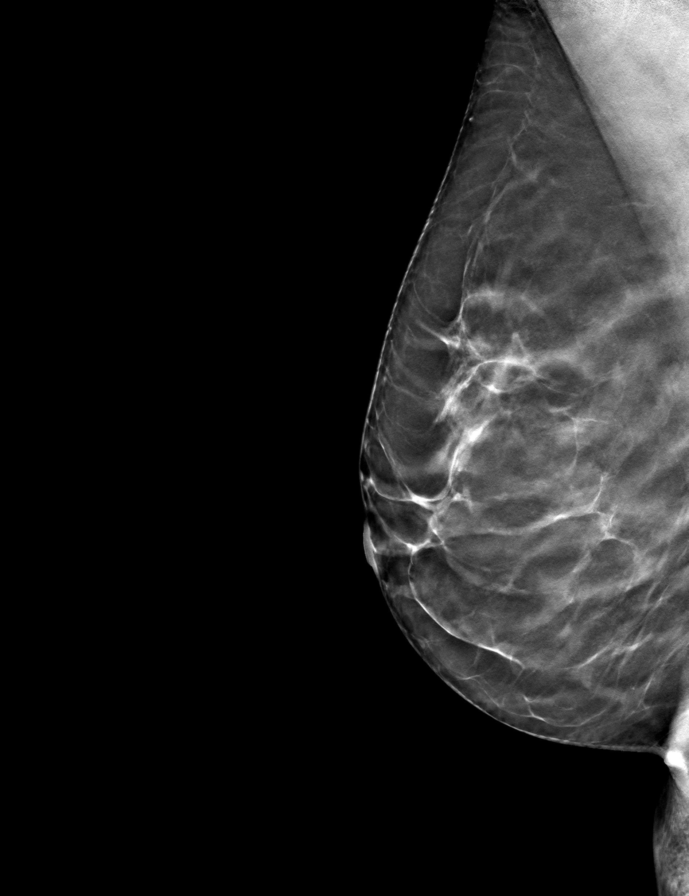

[9 of 24 positions shown; findings below may reference images not displayed]

ACR Breast Density Category c: The breast tissue is heterogeneously
dense, which may obscure small masses.
FINDINGS: There are no findings suspicious for malignancy.
IMPRESSION: No mammographic evidence of malignancy. A result letter of this
screening mammogram will be mailed directly to the patient.

RECOMMENDATION:
Screening mammogram in one year. (Code:Q3-W-BC3)

BI-RADS CATEGORY  1: Negative.
